# Patient Record
Sex: Female | Born: 1950
Health system: Southern US, Community
[De-identification: ages and names within clinical notes are randomized; demographics above are authoritative.]

## PROBLEM LIST (undated history)

## (undated) DIAGNOSIS — E079 Disorder of thyroid, unspecified: Secondary | ICD-10-CM

## (undated) DIAGNOSIS — E785 Hyperlipidemia, unspecified: Secondary | ICD-10-CM

## (undated) DIAGNOSIS — I10 Essential (primary) hypertension: Secondary | ICD-10-CM

## (undated) HISTORY — DX: Disorder of thyroid, unspecified: E07.9

## (undated) HISTORY — DX: Hyperlipidemia, unspecified: E78.5

## (undated) HISTORY — DX: Essential (primary) hypertension: I10

## (undated) HISTORY — PX: MOUTH SURGERY: SHX715

## (undated) HISTORY — PX: OTHER SURGICAL HISTORY: SHX169

---

## 1998-01-06 ENCOUNTER — Other Ambulatory Visit: Admission: RE | Admit: 1998-01-06 | Discharge: 1998-01-06 | Payer: Self-pay | Admitting: Gynecology

## 1999-02-10 ENCOUNTER — Other Ambulatory Visit: Admission: RE | Admit: 1999-02-10 | Discharge: 1999-02-10 | Payer: Self-pay | Admitting: Gynecology

## 2000-01-03 ENCOUNTER — Encounter: Payer: Self-pay | Admitting: Gynecology

## 2000-01-03 ENCOUNTER — Encounter: Admission: RE | Admit: 2000-01-03 | Discharge: 2000-01-03 | Payer: Self-pay | Admitting: Gynecology

## 2000-03-01 ENCOUNTER — Other Ambulatory Visit: Admission: RE | Admit: 2000-03-01 | Discharge: 2000-03-01 | Payer: Self-pay | Admitting: Gynecology

## 2000-03-04 ENCOUNTER — Encounter: Payer: Self-pay | Admitting: Emergency Medicine

## 2000-03-04 ENCOUNTER — Emergency Department (HOSPITAL_COMMUNITY): Admission: EM | Admit: 2000-03-04 | Discharge: 2000-03-04 | Payer: Self-pay | Admitting: Emergency Medicine

## 2001-01-09 ENCOUNTER — Encounter: Payer: Self-pay | Admitting: Gynecology

## 2001-01-09 ENCOUNTER — Encounter: Admission: RE | Admit: 2001-01-09 | Discharge: 2001-01-09 | Payer: Self-pay | Admitting: Gynecology

## 2001-02-26 ENCOUNTER — Other Ambulatory Visit: Admission: RE | Admit: 2001-02-26 | Discharge: 2001-02-26 | Payer: Self-pay | Admitting: Gynecology

## 2002-01-17 ENCOUNTER — Encounter: Admission: RE | Admit: 2002-01-17 | Discharge: 2002-01-17 | Payer: Self-pay | Admitting: Gynecology

## 2002-01-17 ENCOUNTER — Encounter: Payer: Self-pay | Admitting: Gynecology

## 2002-03-05 ENCOUNTER — Other Ambulatory Visit: Admission: RE | Admit: 2002-03-05 | Discharge: 2002-03-05 | Payer: Self-pay | Admitting: Gynecology

## 2003-02-04 ENCOUNTER — Encounter: Payer: Self-pay | Admitting: Gynecology

## 2003-02-04 ENCOUNTER — Encounter: Admission: RE | Admit: 2003-02-04 | Discharge: 2003-02-04 | Payer: Self-pay | Admitting: Gynecology

## 2003-03-30 ENCOUNTER — Other Ambulatory Visit: Admission: RE | Admit: 2003-03-30 | Discharge: 2003-03-30 | Payer: Self-pay | Admitting: Gynecology

## 2004-02-11 ENCOUNTER — Ambulatory Visit (HOSPITAL_COMMUNITY): Admission: RE | Admit: 2004-02-11 | Discharge: 2004-02-11 | Payer: Self-pay | Admitting: Gynecology

## 2004-04-07 ENCOUNTER — Other Ambulatory Visit: Admission: RE | Admit: 2004-04-07 | Discharge: 2004-04-07 | Payer: Self-pay | Admitting: Gynecology

## 2004-09-11 HISTORY — PX: COLONOSCOPY: SHX174

## 2004-09-11 LAB — HM COLONOSCOPY: HM Colonoscopy: NEGATIVE

## 2004-11-23 ENCOUNTER — Ambulatory Visit: Payer: Self-pay | Admitting: Internal Medicine

## 2004-12-15 ENCOUNTER — Ambulatory Visit: Payer: Self-pay | Admitting: Internal Medicine

## 2004-12-22 ENCOUNTER — Ambulatory Visit: Payer: Self-pay | Admitting: Internal Medicine

## 2005-01-05 ENCOUNTER — Ambulatory Visit: Payer: Self-pay | Admitting: Internal Medicine

## 2005-04-24 ENCOUNTER — Other Ambulatory Visit: Admission: RE | Admit: 2005-04-24 | Discharge: 2005-04-24 | Payer: Self-pay | Admitting: Gynecology

## 2005-05-11 ENCOUNTER — Ambulatory Visit (HOSPITAL_COMMUNITY): Admission: RE | Admit: 2005-05-11 | Discharge: 2005-05-11 | Payer: Self-pay | Admitting: Gynecology

## 2005-06-13 ENCOUNTER — Ambulatory Visit: Payer: Self-pay | Admitting: Internal Medicine

## 2005-06-20 ENCOUNTER — Ambulatory Visit: Payer: Self-pay | Admitting: Internal Medicine

## 2005-07-26 ENCOUNTER — Encounter: Admission: RE | Admit: 2005-07-26 | Discharge: 2005-07-26 | Payer: Self-pay

## 2005-12-18 ENCOUNTER — Ambulatory Visit: Payer: Self-pay | Admitting: Internal Medicine

## 2006-03-22 ENCOUNTER — Ambulatory Visit: Payer: Self-pay | Admitting: Internal Medicine

## 2006-06-12 ENCOUNTER — Ambulatory Visit: Payer: Self-pay | Admitting: Internal Medicine

## 2006-07-04 ENCOUNTER — Ambulatory Visit (HOSPITAL_COMMUNITY): Admission: RE | Admit: 2006-07-04 | Discharge: 2006-07-04 | Payer: Self-pay | Admitting: Gynecology

## 2006-07-30 ENCOUNTER — Encounter: Admission: RE | Admit: 2006-07-30 | Discharge: 2006-07-30 | Payer: Self-pay | Admitting: Gynecology

## 2007-02-19 ENCOUNTER — Ambulatory Visit: Payer: Self-pay | Admitting: Internal Medicine

## 2007-03-14 ENCOUNTER — Ambulatory Visit: Payer: Self-pay | Admitting: Internal Medicine

## 2007-08-02 ENCOUNTER — Ambulatory Visit (HOSPITAL_COMMUNITY): Admission: RE | Admit: 2007-08-02 | Discharge: 2007-08-02 | Payer: Self-pay | Admitting: Gynecology

## 2007-08-29 ENCOUNTER — Encounter: Admission: RE | Admit: 2007-08-29 | Discharge: 2007-08-29 | Payer: Self-pay | Admitting: Gynecology

## 2008-05-22 ENCOUNTER — Ambulatory Visit: Payer: Self-pay | Admitting: Internal Medicine

## 2008-07-03 ENCOUNTER — Telehealth: Payer: Self-pay | Admitting: Internal Medicine

## 2008-08-18 ENCOUNTER — Ambulatory Visit (HOSPITAL_COMMUNITY): Admission: RE | Admit: 2008-08-18 | Discharge: 2008-08-18 | Payer: Self-pay | Admitting: Gynecology

## 2008-08-31 ENCOUNTER — Encounter: Admission: RE | Admit: 2008-08-31 | Discharge: 2008-08-31 | Payer: Self-pay | Admitting: Gynecology

## 2009-05-31 ENCOUNTER — Ambulatory Visit: Payer: Self-pay | Admitting: Internal Medicine

## 2009-05-31 DIAGNOSIS — I10 Essential (primary) hypertension: Secondary | ICD-10-CM | POA: Insufficient documentation

## 2009-05-31 DIAGNOSIS — N959 Unspecified menopausal and perimenopausal disorder: Secondary | ICD-10-CM | POA: Insufficient documentation

## 2009-05-31 DIAGNOSIS — I868 Varicose veins of other specified sites: Secondary | ICD-10-CM | POA: Insufficient documentation

## 2009-06-02 ENCOUNTER — Encounter: Payer: Self-pay | Admitting: Internal Medicine

## 2009-06-02 ENCOUNTER — Encounter (INDEPENDENT_AMBULATORY_CARE_PROVIDER_SITE_OTHER): Payer: Self-pay | Admitting: *Deleted

## 2009-06-02 LAB — CONVERTED CEMR LAB
BUN: 13 mg/dL (ref 6–23)
Basophils Absolute: 0 10*3/uL (ref 0.0–0.1)
Chloride: 106 meq/L (ref 96–112)
Eosinophils Absolute: 0.2 10*3/uL (ref 0.0–0.7)
GFR calc non Af Amer: 91.49 mL/min (ref >60)
Glucose, Bld: 74 mg/dL (ref 70–99)
HCT: 41.4 % (ref 36.0–46.0)
Lymphs Abs: 1.7 10*3/uL (ref 0.7–4.0)
MCHC: 33.2 g/dL (ref 30.0–36.0)
MCV: 95.4 fL (ref 78.0–100.0)
Monocytes Absolute: 0.3 10*3/uL (ref 0.1–1.0)
Platelets: 247 10*3/uL (ref 150.0–400.0)
Potassium: 4 meq/L (ref 3.5–5.1)
RDW: 12.3 % (ref 11.5–14.6)
Total Bilirubin: 0.9 mg/dL (ref 0.3–1.2)

## 2009-06-07 ENCOUNTER — Encounter: Admission: RE | Admit: 2009-06-07 | Discharge: 2009-06-07 | Payer: Self-pay | Admitting: Internal Medicine

## 2009-06-08 ENCOUNTER — Ambulatory Visit: Payer: Self-pay | Admitting: Internal Medicine

## 2009-06-11 ENCOUNTER — Telehealth: Payer: Self-pay | Admitting: Internal Medicine

## 2009-06-11 ENCOUNTER — Encounter (INDEPENDENT_AMBULATORY_CARE_PROVIDER_SITE_OTHER): Payer: Self-pay | Admitting: *Deleted

## 2009-06-11 LAB — CONVERTED CEMR LAB
Free T4: 0.7 ng/dL (ref 0.6–1.6)
TSH: 8.25 microintl units/mL — ABNORMAL HIGH (ref 0.35–5.50)

## 2009-06-17 ENCOUNTER — Ambulatory Visit: Payer: Self-pay | Admitting: Internal Medicine

## 2009-06-17 DIAGNOSIS — E785 Hyperlipidemia, unspecified: Secondary | ICD-10-CM | POA: Insufficient documentation

## 2009-06-17 DIAGNOSIS — E041 Nontoxic single thyroid nodule: Secondary | ICD-10-CM | POA: Insufficient documentation

## 2009-08-19 ENCOUNTER — Ambulatory Visit (HOSPITAL_COMMUNITY): Admission: RE | Admit: 2009-08-19 | Discharge: 2009-08-19 | Payer: Self-pay | Admitting: Gynecology

## 2009-08-23 ENCOUNTER — Ambulatory Visit: Payer: Self-pay | Admitting: Internal Medicine

## 2009-08-30 ENCOUNTER — Ambulatory Visit: Payer: Self-pay | Admitting: Internal Medicine

## 2009-08-30 ENCOUNTER — Encounter (INDEPENDENT_AMBULATORY_CARE_PROVIDER_SITE_OTHER): Payer: Self-pay | Admitting: *Deleted

## 2009-08-30 LAB — CONVERTED CEMR LAB
Direct LDL: 144.8 mg/dL
HDL: 52.1 mg/dL (ref >39.00)
TSH: 1.24 microintl units/mL (ref 0.35–5.50)
Total CHOL/HDL Ratio: 4
VLDL: 12.6 mg/dL (ref 0.0–40.0)

## 2009-08-31 ENCOUNTER — Ambulatory Visit: Payer: Self-pay | Admitting: Internal Medicine

## 2009-09-06 ENCOUNTER — Encounter: Admission: RE | Admit: 2009-09-06 | Discharge: 2009-09-06 | Payer: Self-pay | Admitting: Gynecology

## 2009-11-02 ENCOUNTER — Encounter: Payer: Self-pay | Admitting: Internal Medicine

## 2009-11-17 ENCOUNTER — Telehealth: Payer: Self-pay | Admitting: Internal Medicine

## 2009-11-18 ENCOUNTER — Encounter: Admission: RE | Admit: 2009-11-18 | Discharge: 2009-11-18 | Payer: Self-pay | Admitting: Internal Medicine

## 2009-11-18 ENCOUNTER — Ambulatory Visit: Payer: Self-pay | Admitting: Internal Medicine

## 2009-11-19 ENCOUNTER — Telehealth (INDEPENDENT_AMBULATORY_CARE_PROVIDER_SITE_OTHER): Payer: Self-pay | Admitting: *Deleted

## 2009-11-22 ENCOUNTER — Encounter (INDEPENDENT_AMBULATORY_CARE_PROVIDER_SITE_OTHER): Payer: Self-pay | Admitting: *Deleted

## 2009-11-22 ENCOUNTER — Ambulatory Visit: Payer: Self-pay | Admitting: Internal Medicine

## 2009-11-22 LAB — CONVERTED CEMR LAB
Direct LDL: 144.4 mg/dL
VLDL: 11.2 mg/dL (ref 0.0–40.0)

## 2009-11-25 ENCOUNTER — Encounter: Payer: Self-pay | Admitting: Internal Medicine

## 2010-05-17 ENCOUNTER — Telehealth: Payer: Self-pay | Admitting: Internal Medicine

## 2010-05-18 ENCOUNTER — Encounter: Admission: RE | Admit: 2010-05-18 | Discharge: 2010-05-18 | Payer: Self-pay | Admitting: Endocrinology

## 2010-05-18 ENCOUNTER — Ambulatory Visit: Payer: Self-pay | Admitting: Family Medicine

## 2010-05-27 ENCOUNTER — Ambulatory Visit: Payer: Self-pay | Admitting: Internal Medicine

## 2010-05-27 ENCOUNTER — Telehealth (INDEPENDENT_AMBULATORY_CARE_PROVIDER_SITE_OTHER): Payer: Self-pay | Admitting: *Deleted

## 2010-06-06 ENCOUNTER — Encounter: Payer: Self-pay | Admitting: Internal Medicine

## 2010-07-25 ENCOUNTER — Encounter: Payer: Self-pay | Admitting: Internal Medicine

## 2010-07-25 ENCOUNTER — Ambulatory Visit: Payer: Self-pay | Admitting: Internal Medicine

## 2010-07-25 LAB — CONVERTED CEMR LAB
Cholesterol, target level: 200 mg/dL
LDL Goal: 130 mg/dL

## 2010-08-01 LAB — CONVERTED CEMR LAB
Albumin: 4.2 g/dL (ref 3.5–5.2)
Basophils Relative: 0.5 % (ref 0.0–3.0)
CO2: 29 meq/L (ref 19–32)
Chloride: 102 meq/L (ref 96–112)
Cholesterol: 235 mg/dL — ABNORMAL HIGH (ref 0–200)
Creatinine, Ser: 0.7 mg/dL (ref 0.4–1.2)
Direct LDL: 155.2 mg/dL
Eosinophils Absolute: 0.2 10*3/uL (ref 0.0–0.7)
Glucose, Bld: 81 mg/dL (ref 70–99)
Hemoglobin: 13.3 g/dL (ref 12.0–15.0)
MCHC: 34.5 g/dL (ref 30.0–36.0)
MCV: 94.5 fL (ref 78.0–100.0)
Monocytes Absolute: 0.5 10*3/uL (ref 0.1–1.0)
Neutro Abs: 3.8 10*3/uL (ref 1.4–7.7)
Neutrophils Relative %: 60.9 % (ref 43.0–77.0)
RBC: 4.09 M/uL (ref 3.87–5.11)
Total CHOL/HDL Ratio: 4
Total Protein: 6.9 g/dL (ref 6.0–8.3)

## 2010-08-22 ENCOUNTER — Ambulatory Visit (HOSPITAL_COMMUNITY)
Admission: RE | Admit: 2010-08-22 | Discharge: 2010-08-22 | Payer: Self-pay | Source: Home / Self Care | Attending: Gynecology | Admitting: Gynecology

## 2010-09-23 IMAGING — US US SOFT TISSUE HEAD/NECK
1 series · 14 of 24 positions shown · non-contrast
Comparison: Ultrasound of the thyroid of 11/18/2009

CLINICAL DATA: Follow up thyroid nodules, the patient is on
Synthroid

THYROID ULTRASOUND
TECHNIQUE: Ultrasound examination of the thyroid gland and adjacent
soft tissues was performed.

[Series 1: us soft tissue head/neck · 0.07mm/px · 14 of 24 slices shown]
[im 1/24]
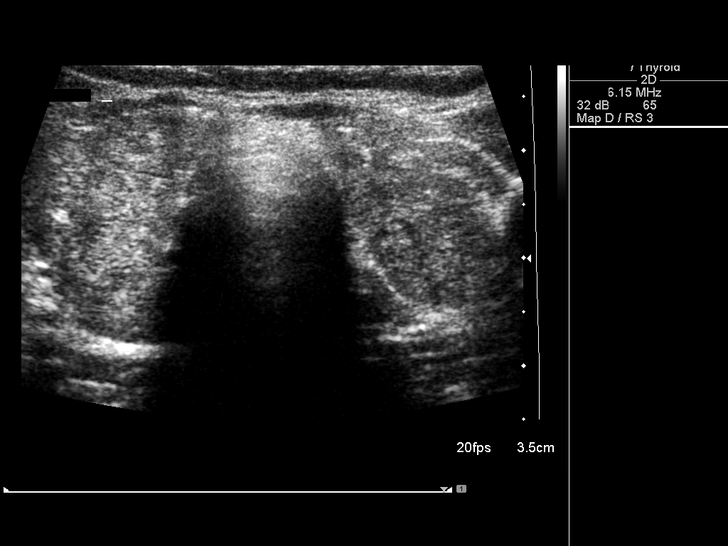
[im 3/24]
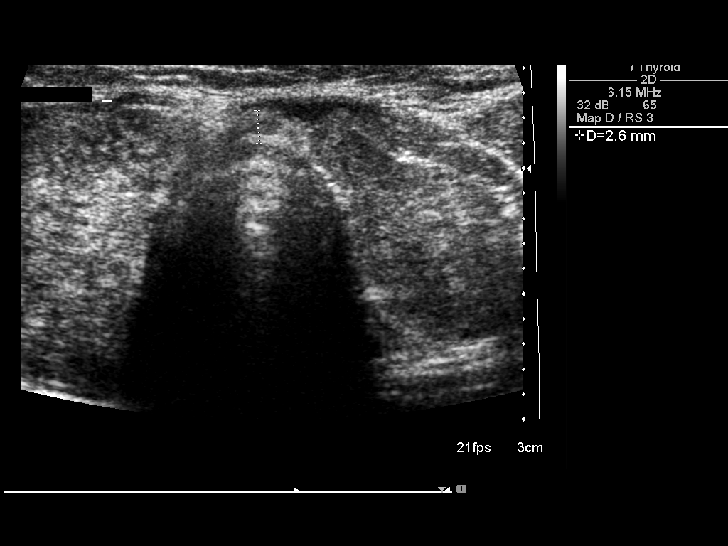
[im 5/24]
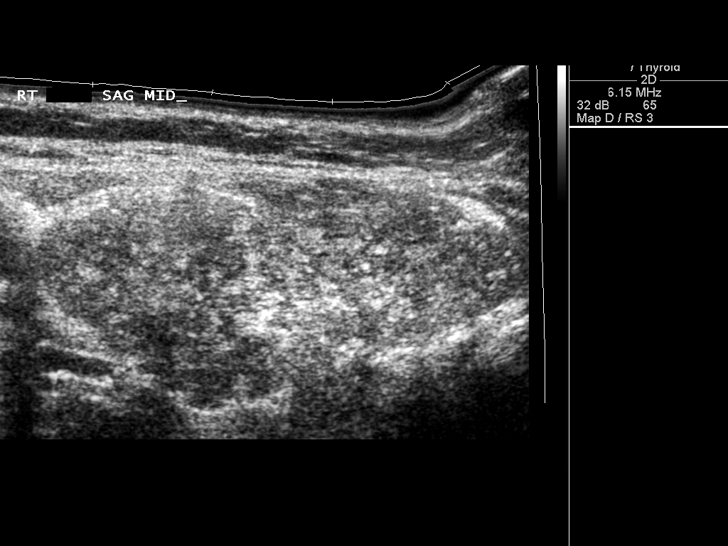
[im 7/24]
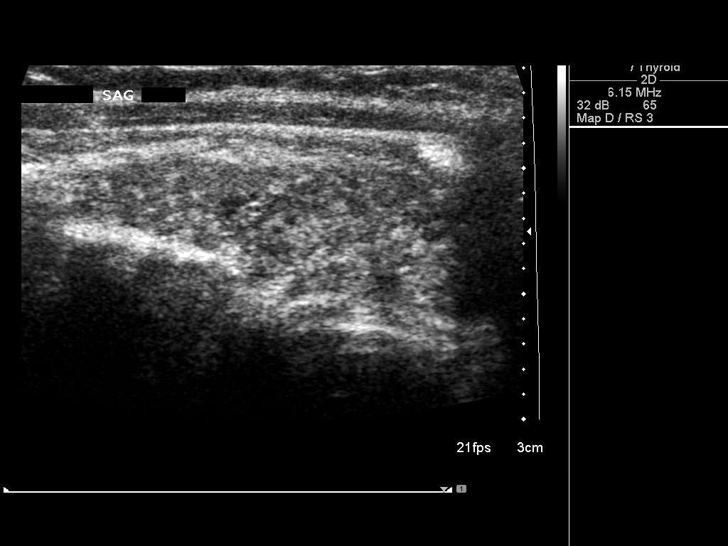
[im 8/24]
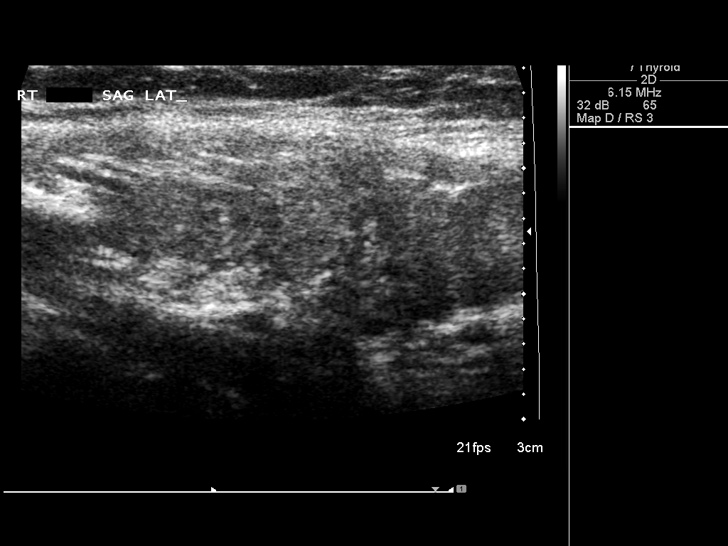
[im 10/24]
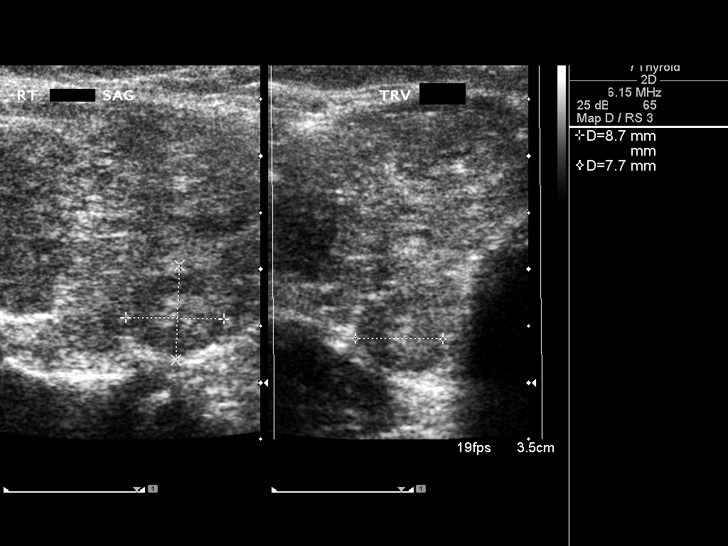
[im 12/24]
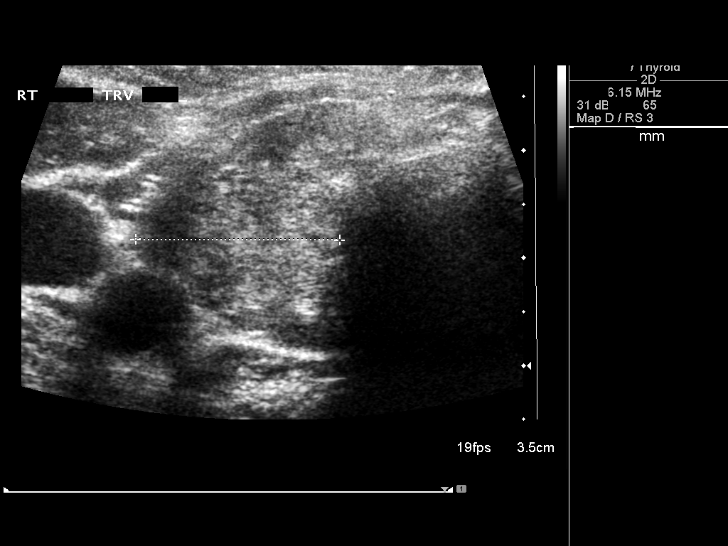
[im 13/24]
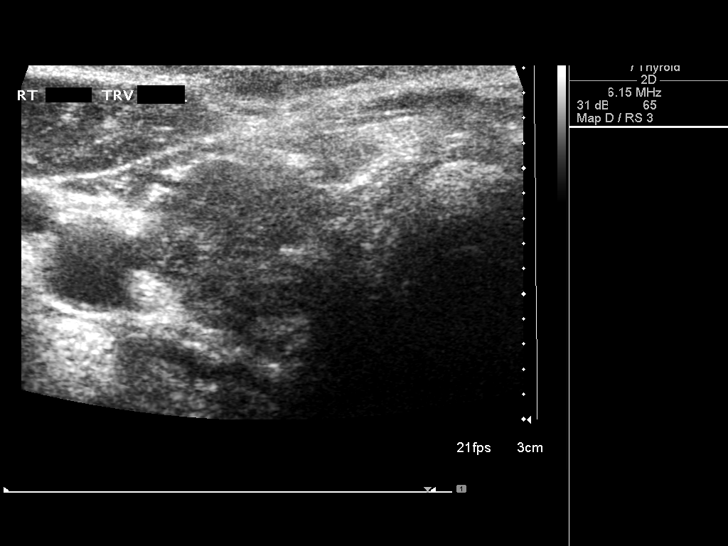
[im 15/24]
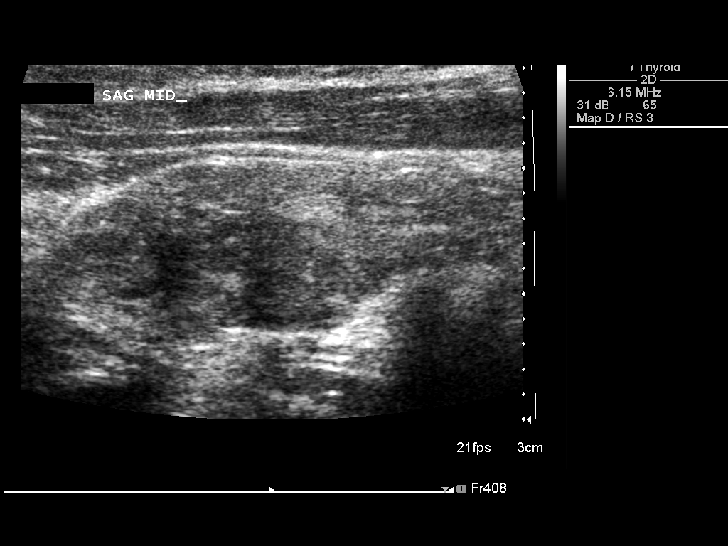
[im 17/24]
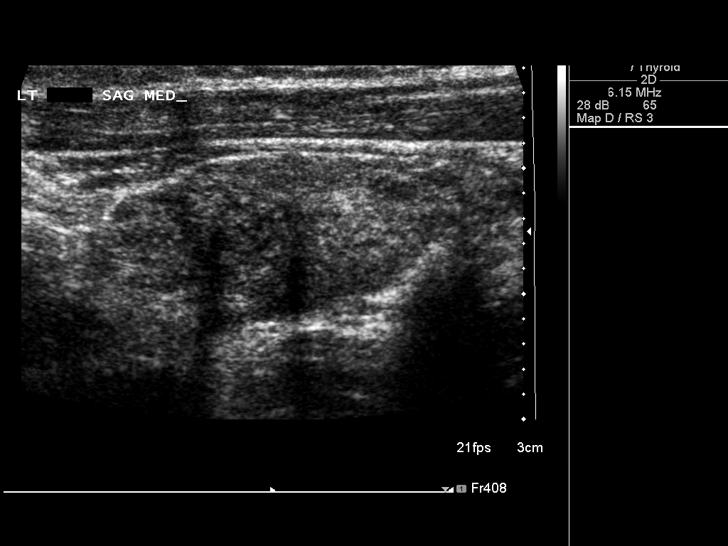
[im 19/24]
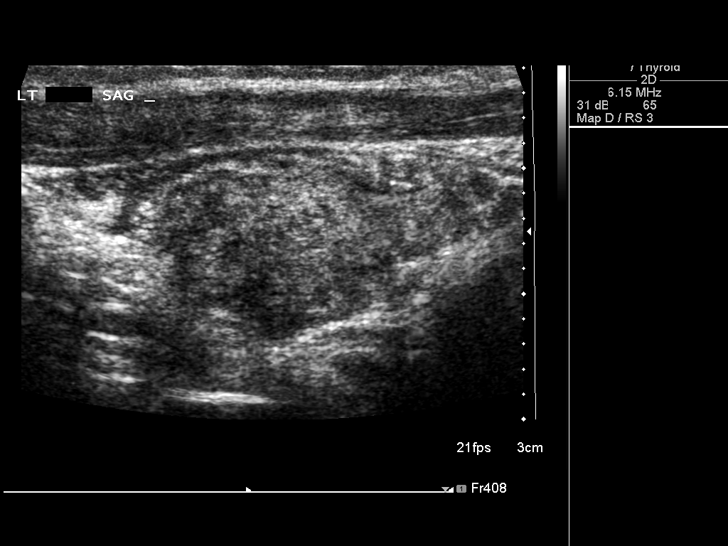
[im 20/24]
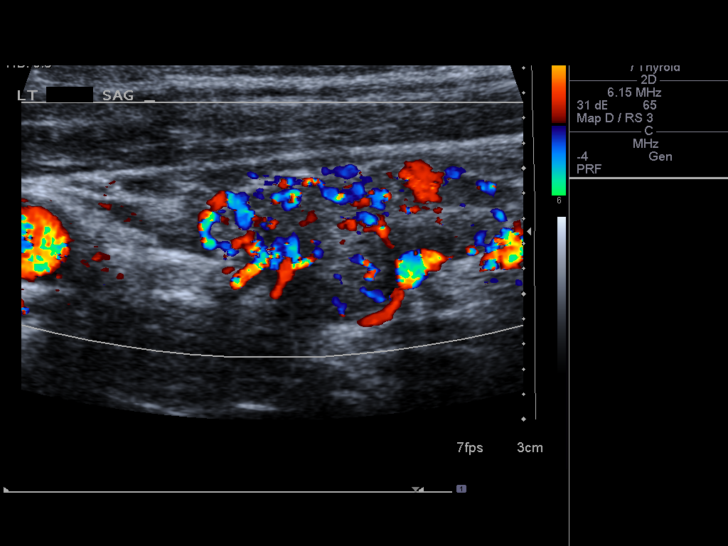
[im 22/24]
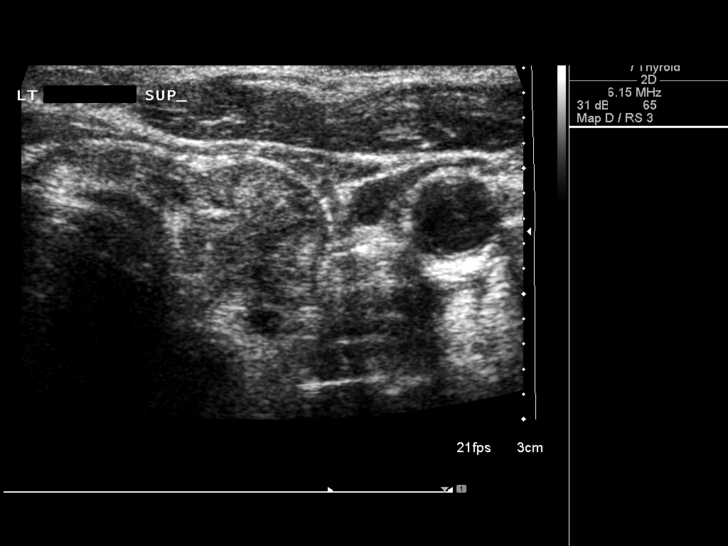
[im 24/24]
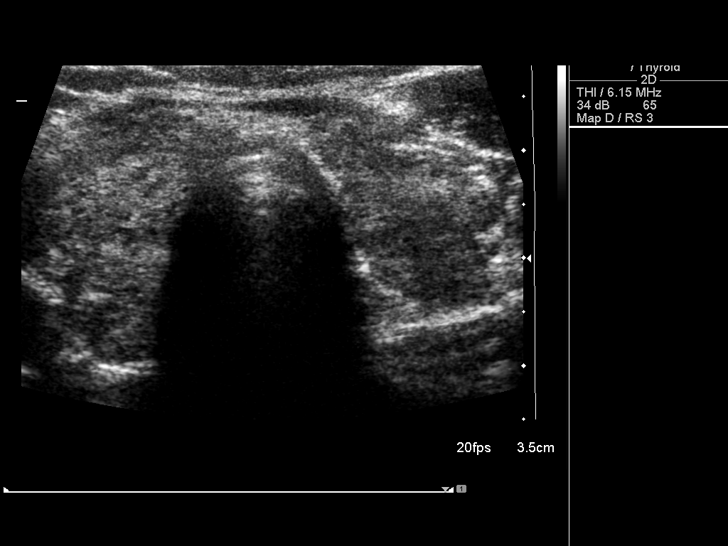

[14 of 24 positions shown; findings below may reference images not displayed]

FINDINGS: Right thyroid lobe:  4.2 x 1.9 x 1.9 cm.  (Previously 5.5 x 2.5 x
2.2 cm).
Left thyroid lobe:  3.7 x 1.4 x 1.5 cm.  (Previously 4.1 x 1.4 x
1.4 cm).
Isthmus:  3 mm which is stable.

Focal nodules:  It the thyroid gland is very inhomogeneous in
echogenicity.  A solid nodule again is noted which is poorly
defined in the lower pole of the right lobe measuring 9 x 8 x 8 mm,
slightly smaller than on the prior study where it measured 12 mm in
maximum diameter.

Lymphadenopathy:  None visualized.
IMPRESSION: Some decrease in size of the right lower lobe thyroid nodule with
slight decrease in size of the thyroid gland which remains
diffusely inhomogeneous.

## 2010-10-02 ENCOUNTER — Encounter: Payer: Self-pay | Admitting: Gynecology

## 2010-10-02 ENCOUNTER — Encounter: Payer: Self-pay | Admitting: Internal Medicine

## 2010-10-04 ENCOUNTER — Encounter
Admission: RE | Admit: 2010-10-04 | Discharge: 2010-10-04 | Payer: Self-pay | Source: Home / Self Care | Attending: Gynecology | Admitting: Gynecology

## 2010-10-13 ENCOUNTER — Encounter: Payer: Self-pay | Admitting: Internal Medicine

## 2010-10-13 NOTE — Consult Note (Signed)
Summary: [facility]   Imported By: Lanelle Bal 12/10/2009 10:05:17  _____________________________________________________________________  External Attachment:    Type:   Image     Comment:   External Document

## 2010-10-13 NOTE — Assessment & Plan Note (Signed)
Summary: FU THYROID U/S /RH.......Jenny Ford   Vital Signs:  Patient profile:   59 year old female Weight:      135 pounds Pulse rate:   74 / minute Resp:     14 per minute BP sitting:   138 / 90  (left arm) Cuff size:   regular  Vitals Entered By: Shonna Chock (November 22, 2009 8:08 AM) CC: Follow-up visit: Discuss Thyroid and Labs (copy given), Hypertension Management Comments REVIEWED MED LIST, PATIENT AGREED DOSE AND INSTRUCTION CORRECT    CC:  Follow-up visit: Discuss Thyroid and Labs (copy given) and Hypertension Management.  History of Present Illness: BP 119/74-138/92 @ home.BP up due toconcerns about her orthopedic issues.  Labs & thyroid US reviewed. RLL essentially unchanged , but TSH up from 1.24 to 4.63. LDL is essentially @ goal of < 140  Hypertension History:      She denies headache, chest pain, palpitations, dyspnea with exertion, orthopnea, PND, peripheral edema, visual symptoms, neurologic problems, syncope, and side effects from treatment.        Positive major cardiovascular risk factors include female age 45 years old or older, hyperlipidemia, and hypertension.  Negative major cardiovascular risk factors include non-tobacco-user status.     Allergies (verified): No Known Drug Allergies  Review of Systems Eyes:  Denies blurring, double vision, and vision loss-both eyes. CV:  Denies leg cramps with exertion. Resp:  Denies cough. Neuro:  Denies numbness and tingling. Allergy:  Denies hives or rash; No lip/tongue swelling.  Physical Exam  General:  Thin but well-nourished; alert,appropriate and cooperative throughout examination Eyes:  No lid lag Neck:  No deformities, masses, or tenderness noted. R thyroid lobe > L Heart:  Normal rate and regular rhythm. S1 and S2 normal without gallop, murmur, click, rub. S4 Pulses:  R and L carotid,radial,dorsalis pedis and posterior tibial pulses are full and equal bilaterally Neurologic:  alert & oriented X3 and DTRs  symmetrical and normal.   No tremor   Impression & Recommendations:  Problem # 1:  HYPOTHYROIDISM (ICD-244.9) Nodule stable despite correction of hypothyroidism. TSH < 5.50 ,but ideal = 1-3. FNA recommended Her updated medication list for this problem includes:    Synthroid 50 Mcg Tabs (Levothyroxine sodium) .Jenny Ford... 1 once daily except 1& 1/2 tues & th  Problem # 2:  HYPERLIPIDEMIA (ICD-272.4) LDL essentially @ NMR Lipoprofile goal of 140  Problem # 3:  THYROID NODULE, RIGHT (ICD-241.0)  Orders: Endocrinology Referral (Endocrine)  Complete Medication List: 1)  Altace 10 Mg Tabs (Ramipril) .... Once daily 2)  Vitamins & Calcium  3)  Synthroid 50 Mcg Tabs (Levothyroxine sodium) .Jenny Ford.. 1 once daily except 1& 1/2 tues & th  Hypertension Assessment/Plan:      The patient's hypertensive risk group is category B: At least one risk factor (excluding diabetes) with no target organ damage.  Today's blood pressure is 138/90.    Patient Instructions: 1)  Referral will be made to an Endocrinologist  for second opinion concerning need for FNA of nodule as recommended. 2)  Check your Blood Pressure regularly. If it is above:135/85 ON AVERAGE  you should make an appointment.  Appended Document: FU THYROID U/S /RH.......Jenny Ford 126/88 BP Recheck

## 2010-10-13 NOTE — Progress Notes (Signed)
Summary: Triage: Request to speak to someone  Phone Note Call from Patient Call back at [phone]   Summary of Call: Message left on VM: Patient would like a call, at that time she will discuss her problem   Shonna Chock CMA  May 17, 2010 4:01 PM   Follow-up for Phone Call        PT states that she has a possible sinus infection. pt c/o headache and nausea. Pt denies any drainage, sore throat or cough.pt states that she has U/S of thyroid schedule for tomorrow so she is unable to come in for OV. pt would like to know if dr hopper would rx something with out seeing her. Pt uses [facility] [address]. pls advise.............Marland KitchenFelecia Deloach CMA  May 17, 2010 4:33 PM     Additional Follow-up for Phone Call Additional follow up Details #1::        Neti pot once daily two times a day followed by Fluticasone nasal spray  until sinuses are clear   Additional Follow-up by: Marga Melnick MD,  May 17, 2010 5:15 PM    Additional Follow-up for Phone Call Additional follow up Details #2::    pt aware rx sent to pharmacy................Marland KitchenFelecia Deloach CMA  May 17, 2010 5:22 PM   New/Updated Medications: FLUTICASONE PROPIONATE 50 MCG/ACT SUSP (FLUTICASONE PROPIONATE) 1 spray two times a day after Neti pot  as needed for sinus congestion Prescriptions: FLUTICASONE PROPIONATE 50 MCG/ACT SUSP (FLUTICASONE PROPIONATE) 1 spray two times a day after Neti pot  as needed for sinus congestion  #1 x 5   Entered and Authorized by:   Marga Melnick MD   Signed by:   Marga Melnick MD on 05/17/2010   Method used:   Printed then faxed to ...       [facility]* (retail)       [address]       [place], [place]  [address]       Ph: [phone]       Fax: [phone]   RxID:   [id number]

## 2010-10-13 NOTE — Assessment & Plan Note (Signed)
Summary: temp - headache/cbs   Vital Signs:  Patient profile:   60 year old female Weight:      135 pounds Temp:     99.4 degrees F oral BP sitting:   130 / 78  (left arm)  Vitals Entered By: Doristine Devoid CMA (May 18, 2010 2:24 PM) CC: fever up to 101.4 HA and sinus pressure    History of Present Illness: 60 yo woman here today for fever and HA.  sxs started late last week.  felt 'pretty good' on Monday- did a lot of housework.  sxs returned yesterday.  developed fever overnight w/ chills and sweats, nausea.  Tm 101.4 today.  nausea is coming in waves.  no vomiting or diarrhea.  + frontal sinus pain.  no tooth pain.  no ear pain, sore throat, cough.  using neti pot and nasal spray.  Allergies (verified): No Known Drug Allergies  Review of Systems      See HPI  Physical Exam  General:  obviously uncomfortable, lying on exam table Head:  NCAT, + TTP over frontal sinuses Eyes:  no injxn or inflammation Ears:  + retraction Nose:  + congestion Mouth:  + PND, no tonsilar enlargement, erythema, exudate Lungs:  Normal respiratory effort, chest expands symmetrically. Lungs are clear to auscultation, no crackles or wheezes. Heart:  Normal rate and regular rhythm. S1 and S2 normal without gallop, murmur, click, rub. S4 Cervical Nodes:  shotty bilateral LAD   Impression & Recommendations:  Problem # 1:  SINUSITIS - ACUTE-NOS (ICD-461.9) Assessment New pt's sxs consistent w/ sinusitis- classic 2nd sickening.  start Amox.  cont nasal spray.  add OTC antihistamine.  phenergan as needed for nausea.  reviewed supportive care and red flags that should prompt return.  Pt expresses understanding and is in agreement w/ this plan. Her updated medication list for this problem includes:    Fluticasone Propionate 50 Mcg/act Susp (Fluticasone propionate) .Marland Kitchen... 1 spray two times a day after neti pot  as needed for sinus congestion    Amoxicillin 500 Mg Tabs (Amoxicillin) .Marland Kitchen... 2 tabs by mouth  two times a day x10 days  Complete Medication List: 1)  Altace 10 Mg Tabs (Ramipril) .... Once daily 2)  Vitamins & Calcium  3)  Synthroid 50 Mcg Tabs (Levothyroxine sodium) .Marland Kitchen.. 1 once daily except 1& 1/2 tues & th 4)  Fluticasone Propionate 50 Mcg/act Susp (Fluticasone propionate) .Marland Kitchen.. 1 spray two times a day after neti pot  as needed for sinus congestion 5)  Amoxicillin 500 Mg Tabs (Amoxicillin) .... 2 tabs by mouth two times a day x10 days 6)  Diflucan 150 Mg Tabs (Fluconazole) .... Once daily.  may repeat in 3 days if symptoms persist 7)  Promethazine Hcl 25 Mg Tabs (Promethazine hcl) .Marland Kitchen.. 1 by mouth q6 as needed for nausea.  will cause drowsiness  Patient Instructions: 1)  This appears to be a sinus infection 2)  Start the Amoxicilin today- take w/ food to avoid upset stomach 3)  Use the Promethazine for the nausea 4)  Continue the nasal spray 5)  Alternate tylenol and ibuprofen every 4 hours for fever, body aches, headaches 6)  Only take the Diflucan if yeast develops 7)  Drink plenty of fluids 8)  Eat what you are able 9)  Hang in there!! Prescriptions: PROMETHAZINE HCL 25 MG  TABS (PROMETHAZINE HCL) 1 by mouth q6 as needed for nausea.  will cause drowsiness  #20 x 0   Entered and Authorized  by:   Neena Rhymes MD   Signed by:   Neena Rhymes MD on 05/18/2010   Method used:   Electronically to        [id number] (retail)       [address]       [place], [place]  [address]       Ph: [phone] or [phone]       Fax: [phone]   RxID:   [id number] DIFLUCAN 150 MG TABS (FLUCONAZOLE) once daily.  may repeat in 3 days if symptoms persist  #2 x 0   Entered and Authorized by:   Neena Rhymes MD   Signed by:   Neena Rhymes MD on 05/18/2010   Method used:   Electronically to        [id number] (retail)       [address]       [place], [place]  [address]       Ph: [phone] or [phone]       Fax: [phone]   RxID:    [id number] AMOXICILLIN 500 MG TABS (AMOXICILLIN) 2 tabs by mouth two times a day x10 days  #40 x 0   Entered and Authorized by:   Neena Rhymes MD   Signed by:   Neena Rhymes MD on 05/18/2010   Method used:   Electronically to        [id number] (retail)       [address]       [place], [place]  [address]       Ph: [phone] or [phone]       Fax: [phone]   RxID:   [id number]

## 2010-10-13 NOTE — Progress Notes (Signed)
Summary: Phone Note-Request for Results  Phone Note Outgoing Call Call back at [phone]   Call placed by: Shonna Chock,  November 19, 2009 5:14 PM Call placed to: Patient Summary of Call: Patient called Felicia's VM indicating that she would like test results.  I called patient and was unable to reach her, left message on VM with the information below, patient to call on Monday if any questions or concerns.  No change in nodules over 6 months. Annual palpation of thyroid with [place] repeat based on that exam & clinical status. Chrae Malloy  November 19, 2009 5:19 PM   Follow-up for Phone Call        Patient here today for follow-up on U/S and to discuss labs Follow-up by: Shonna Chock,  November 22, 2009 8:16 AM

## 2010-10-13 NOTE — Assessment & Plan Note (Signed)
Summary: FOLLOWUP FROM DR TABORI--SINUS///SPH   Vital Signs:  Patient profile:   60 year old female Weight:      136 pounds BMI:     23.61 Temp:     98.2 degrees F oral Pulse rate:   76 / minute Resp:     16 per minute BP sitting:   144 / 98  (left arm) Cuff size:   regular  Vitals Entered By: Shonna Chock CMA (May 27, 2010 10:23 AM) CC: 1 Week follow-up, patient still with pressure over right eye, Headaches   CC:  1 Week follow-up, patient still with pressure over right eye, and Headaches.  History of Present Illness: Headache  as" pressure" above OD persists despite resolution of fever with antibiotics.  The patient denies nausea, vomiting, sweats, tearing of eyes, photophobia, and phonophobia.  The headache is described as constant unless NSAIDS or Tylenol taken.  The location of the pain is only  unilateral on the right.  The patient denies the following high-risk features: fever, neck pain/stiffness, and rash.  Prior treatment has included a NSAID and acetaminophen.No PMH of migraines.    Current Medications (verified): 1)  Altace 10 Mg  Tabs (Ramipril) .... Once Daily 2)  Vitamins & Calcium 3)  Synthroid 75 Mcg Tabs (Levothyroxine Sodium) .Marland Kitchen.. 1 By Mouth Once Daily 4)  Fluticasone Propionate 50 Mcg/act Susp (Fluticasone Propionate) .Marland Kitchen.. 1 Spray Two Times A Day After Neti Pot  As Needed For Sinus Congestion 5)  Amoxicillin 500 Mg Tabs (Amoxicillin) .... 2 Tabs By Mouth Two Times A Day X10 Days  Allergies (verified): No Known Drug Allergies  Review of Systems General:  Denies chills. Eyes:  Denies discharge, eye pain, and red eye. Resp:  Denies cough and sputum productive.  Physical Exam  General:  in no acute distress; alert,appropriate and cooperative throughout examination Eyes:  No corneal or conjunctival inflammation noted. EOMI. Perrla.  Vision grossly normal. Ears:  External ear exam shows no significant lesions or deformities.  Otoscopic examination reveals  clear canals, tympanic membranes are intact bilaterally without bulging, retraction, inflammation or discharge. Hearing is grossly normal bilaterally. Nose:  External nasal examination shows no deformity or inflammation. Nasal mucosa are pink and moist without lesions or exudates. Mouth:  Oral mucosa and oropharynx without lesions or exudates.  Teeth in good repair. Cervical Nodes:  No lymphadenopathy noted Axillary Nodes:  No palpable lymphadenopathy   Impression & Recommendations:  Problem # 1:  HEADACHE (ICD-784.0) ? migraine variant ,ie chronic daily headache Orders: T-Sinuses 1-2 Views (70210TC)  Her updated medication list for this problem includes:    Tramadol Hcl 50 Mg Tabs (Tramadol hcl) .Marland Kitchen... 1 every 6 hrs as needed for pain  Problem # 2:  SINUSITIS - ACUTE-NOS (ICD-461.9)  Her updated medication list for this problem includes:    Fluticasone Propionate 50 Mcg/act Susp (Fluticasone propionate) .Marland Kitchen... 1 spray two times a day after neti pot  as needed for sinus congestion    Amoxicillin 500 Mg Tabs (Amoxicillin) .Marland Kitchen... 2 tabs by mouth two times a day x10 days  Orders: T-Sinuses 1-2 Views (70210TC)  Complete Medication List: 1)  Altace 10 Mg Tabs (Ramipril) .... Once daily 2)  Vitamins & Calcium  3)  Synthroid 75 Mcg Tabs (Levothyroxine sodium) .Marland Kitchen.. 1 by mouth once daily 4)  Fluticasone Propionate 50 Mcg/act Susp (Fluticasone propionate) .Marland Kitchen.. 1 spray two times a day after neti pot  as needed for sinus congestion 5)  Amoxicillin 500 Mg Tabs (Amoxicillin) .Marland KitchenMarland KitchenMarland Kitchen  2 tabs by mouth two times a day x10 days 6)  Tramadol Hcl 50 Mg Tabs (Tramadol hcl) .Marland Kitchen.. 1 every 6 hrs as needed for pain  Patient Instructions: 1)  Try Excedrin Migraine as needed @ 1st sign of headache. Prescriptions: TRAMADOL HCL 50 MG TABS (TRAMADOL HCL) 1 every 6 hrs as needed for pain  #30 x 0   Entered and Authorized by:   Marga Melnick MD   Signed by:   Marga Melnick MD on 05/27/2010   Method used:   Faxed  to ...       [id number] (retail)       [address]       [place], [place]  [address]       Ph: [phone] or [phone]       Fax: [phone]   RxID:   [id number]

## 2010-10-13 NOTE — Progress Notes (Signed)
Summary: FYI- no show for thyroid ultrasound   Phone Note From Other Clinic   Caller: [place] Summary of Call: patient no showed for thyroid ultrasound. Initial call taken by: Doristine Devoid,  November 17, 2009 4:57 PM  Follow-up for Phone Call        noted; please call to inquire why Follow-up by: Marga Melnick MD,  November 18, 2009 10:40 AM  Additional Follow-up for Phone Call Additional follow up Details #1::        left message to call office..............Marland KitchenFelecia Deloach CMA  November 18, 2009 11:08 AM   pt return call there was a mix up with appointment pt would like to go today to have procedure done. infor given to referral coordinator to reschedule appt.........Marland KitchenFelecia Deloach CMA  November 18, 2009 11:19 AM     Additional Follow-up for Phone Call Additional follow up Details #2::    I S/W PATIENT, SHE IS CALLING [facility] TO RSC'D HER MISSED U/S APPT, & WILL HAVE DONE TODAY AT ONE OF [facility]'S LOCATIONS. Follow-up by: Magdalen Spatz [place],  November 18, 2009 11:59 AM

## 2010-10-13 NOTE — Letter (Signed)
Summary: Primary Care Consult Scheduled Letter  [facility] [place]  [address] [place], [place] [address]   Phone: [phone]  Fax: [phone]      11/22/2009 MRN: [id number]  [facility] [address] [place], [place]  [address]    Dear Jenny Ford,    We have scheduled an appointment for you.  At the recommendation of Dr. Marga Melnick, we have scheduled you a consult with Dr. Dorisann Frames of [facility] on 01-03-2010 at 10:00am.  Their address is [address], [place] [address]. The office phone number is [phone].  If this appointment day and time is not convenient for you, please feel free to call the office of the doctor you are being referred to at the number listed above and reschedule the appointment.    It is important for you to keep your scheduled appointments. We are here to make sure you are given good patient care.   Thank you,    Renee, Patient Care Coordinator [place] [facility]

## 2010-10-13 NOTE — Miscellaneous (Signed)
Summary: [facility]  [facility]   Imported By: Lanelle Bal 11/19/2009 08:42:01  _____________________________________________________________________  External Attachment:    Type:   Image     Comment:   External Document

## 2010-10-13 NOTE — Assessment & Plan Note (Signed)
Summary: cpx & lab/cbs   Vital Signs:  Patient profile:   60 year old female Height:      64 inches Weight:      137 pounds BMI:     23.60 Temp:     98.0 degrees F oral Pulse rate:   64 / minute Resp:     14 per minute BP sitting:   142 / 98  (left arm) Cuff size:   regular  Vitals Entered By: Shonna Chock CMA (July 25, 2010 9:56 AM) CC: CPX and fasting labs , recheck on B/P 142/96, Lipid Management Comments Patient refused to update any vaccines today    CC:  CPX and fasting labs , recheck on B/P 142/96, and Lipid Management.  History of Present Illness:          Mrs. Tax is here for a physical; she is essentially  asymptomatic.             Hypertension Follow-Up: The patient denies lightheadedness, urinary frequency, headaches, edema, and fatigue.  The patient denies the following associated symptoms: chest pain, chest pressure, exercise intolerance, dyspnea, palpitations, and syncope.  Compliance with medications (by patient report) has been near 100%.  The patient reports that dietary compliance has been good.  The patient reports exercising 3X per week as treadmill, cycle & now Zumba.  Adjunctive measures currently used by the patient include salt restriction.  BP @ 125/81 as average.   Lipid Management History:      Positive NCEP/ATP III risk factors include female age 72 years old or older, family history for ischemic heart disease (males less than 23 years old), and hypertension.  Negative NCEP/ATP III risk factors include non-diabetic, non-tobacco-user status, no ASHD (atherosclerotic heart disease), no prior stroke/TIA, no peripheral vascular disease, and no history of aortic aneurysm.     Allergies (verified): No Known Drug Allergies  Past History:  Past Medical History: Hypertension Hyperlipidemia: LDL 161(0960/454), TG 106, HDL 57; LDL goal = < 140, ideally < 100. Framingham Study LDL goal = < 130. Thyroid nodue , Dr Talmage Nap  Past Surgical History: G2 P2 ;  Septoplasty ; Colonoscopy negative  2006, Dr Juanda Chance; Tendon tear LLE , Dr Harriet Pho, Podiatrist  Family History: Father: COAD, Anaphylaxis to Allergy shot Mother: breast cancer, CAD Siblings: bro :CAD, anxiety,stents ;twin: COAD; M uncles: MI (one in 64s)  Social History: Occupation:Senior Chemical engineer Never Smoked Alcohol use-yes: essentially none  Review of Systems  The patient denies anorexia, fever, weight loss, weight gain, vision loss, decreased hearing, prolonged cough, hemoptysis, abdominal pain, melena, hematochezia, severe indigestion/heartburn, hematuria, suspicious skin lesions, depression, unusual weight change, abnormal bleeding, enlarged lymph nodes, and angioedema.         Dr Talmage Nap states nodule has shrunk  by [place] measure Allergy:  Complains of itching eyes; Symptoms controlled by Claritin.Marland Kitchen  Physical Exam  General:  well-nourished,alert,appropriate and cooperative throughout examination Head:  Normocephalic and atraumatic without obvious abnormalities. Eyes:  No corneal or conjunctival inflammation noted. Perrla. Funduscopic exam benign, without hemorrhages, exudates or papilledema.  Ears:  External ear exam shows no significant lesions or deformities.  Otoscopic examination reveals clear canals, tympanic membranes are intact bilaterally without bulging, retraction, inflammation or discharge. Hearing is grossly normal bilaterally. Nose:  External nasal examination shows no deformity or inflammation. Nasal mucosa are pink and moist without lesions or exudates. Mouth:  Oral mucosa and oropharynx without lesions or exudates.  Teeth in good repair. Neck:  No deformities, masses, or  tenderness noted. R lobe larger than L ; no nodues palpable Lungs:  Normal respiratory effort, chest expands symmetrically. Lungs are clear to auscultation, no crackles or wheezes. Heart:  normal rate, regular rhythm, no gallop, no rub, no JVD, no HJR, and grade 1 /6 systolic  murmur.   Abdomen:  Bowel sounds positive,abdomen soft and non-tender without masses, organomegaly or hernias noted. Genitalia:  Dr Greta Doom Msk:  No deformity or scoliosis noted of thoracic or lumbar spine.   Pulses:  R and L carotid,radial,dorsalis pedis and posterior tibial pulses are full and equal bilaterally Extremities:  No clubbing, cyanosis, edema, or deformity noted with normal full range of motion of all joints.   Neurologic:  alert & oriented X3 and DTRs symmetrical and normal.   Skin:  Intact without suspicious lesions or rashes Cervical Nodes:  No lymphadenopathy noted Axillary Nodes:  No palpable lymphadenopathy Psych:  memory intact for recent and remote, normally interactive, and good eye contact.     Impression & Recommendations:  Problem # 1:  ROUTINE GENERAL MEDICAL EXAM@HEALTH  CARE FACL (ICD-V70.0)  Orders: EKG w/ Interpretation (93000) Venipuncture (16109) TLB-Lipid Panel (80061-LIPID) TLB-BMP (Basic Metabolic Panel-BMET) (80048-METABOL) TLB-CBC Platelet - w/Differential (85025-CBCD) TLB-Hepatic/Liver Function Pnl (80076-HEPATIC)  Problem # 2:  HYPERTENSION (ICD-401.9)  Her updated medication list for this problem includes:    Altace 10 Mg Tabs (Ramipril) ..... Once daily  Problem # 3:  THYROID NODULE, RIGHT (ICD-241.0)  Problem # 4:  HYPERLIPIDEMIA (ICD-272.4)  Complete Medication List: 1)  Altace 10 Mg Tabs (Ramipril) .... Once daily 2)  Vitamins & Calcium  3)  Synthroid 75 Mcg Tabs (Levothyroxine sodium) .Marland Kitchen.. 1 by mouth once daily 4)  Tramadol Hcl 50 Mg Tabs (Tramadol hcl) .Marland Kitchen.. 1 every 6 hrs as needed for pain 5)  Loratadine 10 Mg Tabs (Loratadine) .Marland Kitchen.. 1 once daily as needed  Lipid Assessment/Plan:      Based on NCEP/ATP III, the patient's risk factor category is "2 or more risk factors and a calculated 10 year CAD risk of > 20%".  The patient's lipid goals are as follows: Total cholesterol goal is 200; LDL cholesterol goal is 130; HDL cholesterol goal  is 40; Triglyceride goal is 150.    Patient Instructions: 1)  Take your BP cuff to all medical appts. 2)  Check your Blood Pressure regularly. Your goal = AVERAGE  < 135/85. Prescriptions: LORATADINE 10 MG TABS (LORATADINE) 1 once daily as needed  #90 x 3   Entered and Authorized by:   Marga Melnick MD   Signed by:   Marga Melnick MD on 07/25/2010   Method used:   Print then Give to Patient   RxID:   [id number] ALTACE 10 MG  TABS (RAMIPRIL) once daily  #90 x 3   Entered and Authorized by:   Marga Melnick MD   Signed by:   Marga Melnick MD on 07/25/2010   Method used:   Faxed to ...       [id number] (retail)       [address]       [place], [place]  [address]       Ph: [phone] or [phone]       Fax: [phone]   RxID:   [id number]    Orders Added: 1)  Est. Patient 40-64 years [99396] 2)  EKG w/ Interpretation [93000] 3)  Venipuncture [36415] 4)  TLB-Lipid Panel [80061-LIPID] 5)  TLB-BMP (Basic Metabolic Panel-BMET) [80048-METABOL] 6)  TLB-CBC Platelet - w/Differential [85025-CBCD]  7)  TLB-Hepatic/Liver Function Pnl [80076-HEPATIC]     Appended Document: cpx & lab/cbs

## 2010-10-13 NOTE — Letter (Signed)
Summary: [facility]   Imported By: Lanelle Bal 06/22/2010 13:15:05  _____________________________________________________________________  External Attachment:    Type:   Image     Comment:   External Document

## 2010-10-13 NOTE — Progress Notes (Signed)
Summary: DG Sinuses results  Phone Note Outgoing Call Call back at [facility] Phone [phone]   Call placed by: Shonna Chock CMA,  May 27, 2010 4:59 PM Call placed to: Patient Summary of Call: Left message on voicemail informing patient of results:  Super , no sinusitis present (copy of report to be mailed)  Shonna Chock CMA  May 27, 2010 5:00 PM

## 2010-11-02 NOTE — Letter (Signed)
Summary: [facility]  [facility]   Imported By: Maryln Gottron 10/26/2010 15:07:01  _____________________________________________________________________  External Attachment:    Type:   Image     Comment:   External Document

## 2011-04-07 ENCOUNTER — Encounter: Payer: Self-pay | Admitting: Internal Medicine

## 2011-05-22 ENCOUNTER — Other Ambulatory Visit: Payer: Self-pay | Admitting: Endocrinology

## 2011-05-22 DIAGNOSIS — E049 Nontoxic goiter, unspecified: Secondary | ICD-10-CM

## 2011-05-26 ENCOUNTER — Ambulatory Visit
Admission: RE | Admit: 2011-05-26 | Discharge: 2011-05-26 | Disposition: A | Discharge status: Home / Self Care | Payer: BC Managed Care – PPO | Source: Ambulatory Visit | Attending: Endocrinology | Admitting: Endocrinology

## 2011-05-26 DIAGNOSIS — E049 Nontoxic goiter, unspecified: Secondary | ICD-10-CM

## 2011-06-12 ENCOUNTER — Telehealth: Payer: Self-pay | Admitting: Internal Medicine

## 2011-06-12 NOTE — Telephone Encounter (Signed)
Patient called at 2:15 waned appt said bp up & has been up several days - scheduled appt [id number] but patient became tearful & said she just feels bad

## 2011-06-12 NOTE — Telephone Encounter (Signed)
BP this am 169/106. Pt c/o headache, increased stress, and elevated BP. Pt notes that she had stress fracture in left foot x6 weeks ago and just got boot removed x1week. Pt denies SOB, dizziness, chest pain or numbness or tingling in arms. Pt advise if symptoms increase ED otherwise f/u @ Scheduled OV tomorrow.

## 2011-06-12 NOTE — Telephone Encounter (Signed)
Amlodipine 5 mg daily dispense 30. Blood pressure monitor as follows; office visit if no better.Blood Pressure Goal  Ideally is an AVERAGE < 135/85. This AVERAGE should be calculated from @ least 5-7 BP readings taken @ different times of day on different days of week. You should not respond to isolated BP readings , but rather the AVERAGE for that week

## 2011-06-13 ENCOUNTER — Ambulatory Visit (INDEPENDENT_AMBULATORY_CARE_PROVIDER_SITE_OTHER): Payer: BC Managed Care – PPO | Admitting: Internal Medicine

## 2011-06-13 DIAGNOSIS — I10 Essential (primary) hypertension: Secondary | ICD-10-CM

## 2011-06-13 DIAGNOSIS — R002 Palpitations: Secondary | ICD-10-CM

## 2011-06-13 DIAGNOSIS — R519 Headache, unspecified: Secondary | ICD-10-CM

## 2011-06-13 DIAGNOSIS — R51 Headache: Secondary | ICD-10-CM

## 2011-06-13 MED ORDER — TRAMADOL HCL 50 MG PO TABS: 50.0000 mg | ORAL_TABLET | Freq: Four times a day (QID) | ORAL | Status: DC | PRN

## 2011-06-13 MED ORDER — AMLODIPINE BESYLATE 5 MG PO TABS: 5.0000 mg | ORAL_TABLET | Freq: Every day | ORAL | Status: DC

## 2011-06-13 NOTE — Progress Notes (Signed)
Subjective:    Patient ID: Jenny Ford, female    DOB: 04/21/1951, 59 y.o.   MRN: [id number]  HPI BP ELEVATION:  She relates her significant blood pressure rises ( up to 161/104 two days ago) to an increase in her Synthroid dose by her endocrinologist. Specifically on 9/17 Synthroid was raised from 75-88 mcg. This was to suppress growth of a thyroid nodule. At that time her TSH was 2.65, a therapeutic value, but probably not high enough to prevent  Progression of nodules Disease Monitoring  Blood pressure range:low of  116/76 on 9/3  Chest pain: no   Dyspnea: no             Palpitations when  anxious  Claudication: no             Headaches : in am , ?due to coffee ; relieved with OTC migraine pill occasionally ( ? Excedrin migraine equivalent)  Medication compliance: yes  Medication Side Effects  Lightheadedness: no   Urinary frequency: no   Edema: no   I   Preventitive Healthcare:  Exercise: no , due to MS issues. 18 mos ago she injured her left ankle tendon. She experienced a stress fracture in the left foot 7/12. She will been for 5 weeks. She denies significant pain at this time.  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 Wearing the boot for 5 weeks was also a ignificant stress.     Objective:   Physical Exam Gen.: Healthy and well-nourished in appearance. Alert, appropriate and cooperative throughout exam. Head: Normocephalic without obvious abnormalities  Eyes: No corneal or conjunctival inflammation noted. Pupils equal round reactive to light and accommodation. Fundal exam is benign without hemorrhages,or  Exudate. I can not optimally assess disc edges. Extraocular motion intact. Vision grossly normal. No lid lag  Neck: No deformities, masses, or tenderness noted. Thyroid : Enlarged right firm thyroid nodule; smaller from left lobe. Lungs:  Normal respiratory effort; chest expands symmetrically. Lungs are clear to auscultation without rales, wheezes, or increased work of breathing. Heart: Normal rate and rhythm. Normal S1 and S2. No gallop, click, or rub. S 4 w/o murmur. Abdomen: Bowel sounds normal; abdomen soft and nontender. No masses, organomegaly or hernias noted. No aortic aneurysm or renal artery bruits                                                                              Musculoskeletal/extremities:  No clubbing, cyanosis, edema, or deformity noted. .Joints normal. Nail health  Good; no onycholysis Vascular: Carotid, radial artery, dorsalis pedis and  posterior tibial pulses are full and equal. No bruits present. Neurologic: Alert and oriented x3. Deep tendon reflexes symmetrical and normal. No tremor         Skin: Intact without suspicious lesions or rashes. Lymph: No cervical, axillary lymphadenopathy present. Psych: Mood and affect are normal. Normally interactive  Assessment & Plan:  #1 hypertension exacerbation, multifactorial. Factors include exogenous stress, decreased exercise and medications for headaches.  #2 thyroid nodule, responsive to thyroid replacement. Her endocrinologist will determine optimal TSH; I would expect that number to be in the range of at least 0.35. She should discuss her concerns about the Synthroid with endocrinologist. Nodule which would be expected to increase in size, more likely than not, and less as adequate suppression  #3 headaches due to hypertension  Plan: See orders and recommendations

## 2011-06-13 NOTE — Patient Instructions (Addendum)
Your BP goal = AVERAGE < 135/85. Avoid ingestion of  excess salt/sodium.Cook with pepper & other spices . Use the salt substitute "No Salt"(unless your potassium has been elevated) OR the Mrs Sharilyn Sites products to season food @ the table. Avoid foods which taste salty or "vinegary" as their sodium contentet will be high.  To prevent palpitations or premature beats, avoid stimulants such as decongestants, diet pills, nicotine, or caffeine (coffee, tea, cola, or chocolate) to excess.  Please keep a diary of your headaches . Document  each occurrence on the calendar with notation of : #1 any prodrome ( any non headache symptom such as marked fatigue,visual changes, ,etc ) which precedes actual headache ; #2) severity on 1-10 scale; #3) any triggers ( food/ drink,enviromenntal or weather changes ,physical or emotional stress) in 8-12 hour period prior to the headache; & #4) response to any medications or other intervention. Please have your ophthalmologist re- evaluate your fundi because of elevated blood pressure.

## 2011-06-13 NOTE — Telephone Encounter (Signed)
Discuss with patient. Pt would rather wait to discuss issues further at appt before starting any meds. Pt has OV for today scheduled.

## 2011-06-23 ENCOUNTER — Telehealth: Payer: Self-pay | Admitting: *Deleted

## 2011-06-23 NOTE — Telephone Encounter (Signed)
BP average: 136.9/ 87.8. Isolated BP 152/102 today,  129/93 yesterday, 06-21-11 BP 143/95. Pt is asymptomatic and headache have resolved. Pt has pending OV 07-27-11.Marland KitchenPlease advise

## 2011-06-23 NOTE — Telephone Encounter (Signed)
Discuss with patient  

## 2011-06-23 NOTE — Telephone Encounter (Signed)
The  blood pressure is almost at the preferred goal of an average of less than 135/85. It is stable from this average; increase the amlodipine from 5 mg to one & 1/2  half or 7.5 mg daily.

## 2011-07-17 ENCOUNTER — Other Ambulatory Visit: Payer: Self-pay | Admitting: Internal Medicine

## 2011-07-19 ENCOUNTER — Telehealth: Payer: Self-pay | Admitting: *Deleted

## 2011-07-19 NOTE — Telephone Encounter (Signed)
Lipid/Hep/BMP/CBCD/TSH/Stool Cards v70.0/272.4/995.20/401.9

## 2011-07-19 NOTE — Telephone Encounter (Signed)
Pt left VM that she is scheduled on 07-27-11 for CPX. Pt would like to have labs done prior to that. Please advise on lab orders and Dx.

## 2011-07-21 ENCOUNTER — Other Ambulatory Visit: Payer: Self-pay | Admitting: Internal Medicine

## 2011-07-21 DIAGNOSIS — Z0000 Encounter for general adult medical examination without abnormal findings: Secondary | ICD-10-CM

## 2011-07-21 DIAGNOSIS — E785 Hyperlipidemia, unspecified: Secondary | ICD-10-CM

## 2011-07-21 DIAGNOSIS — T887XXA Unspecified adverse effect of drug or medicament, initial encounter: Secondary | ICD-10-CM

## 2011-07-21 DIAGNOSIS — I10 Essential (primary) hypertension: Secondary | ICD-10-CM

## 2011-07-24 ENCOUNTER — Other Ambulatory Visit (INDEPENDENT_AMBULATORY_CARE_PROVIDER_SITE_OTHER): Payer: BC Managed Care – PPO

## 2011-07-24 DIAGNOSIS — Z0000 Encounter for general adult medical examination without abnormal findings: Secondary | ICD-10-CM

## 2011-07-24 DIAGNOSIS — T887XXA Unspecified adverse effect of drug or medicament, initial encounter: Secondary | ICD-10-CM

## 2011-07-24 DIAGNOSIS — I10 Essential (primary) hypertension: Secondary | ICD-10-CM

## 2011-07-24 DIAGNOSIS — E785 Hyperlipidemia, unspecified: Secondary | ICD-10-CM

## 2011-07-24 NOTE — Progress Notes (Signed)
12  

## 2011-07-25 LAB — CBC WITH DIFFERENTIAL/PLATELET
Basophils Relative: 0.3 % (ref 0.0–3.0)
Eosinophils Absolute: 0.1 10*3/uL (ref 0.0–0.7)
HCT: 37.5 % (ref 36.0–46.0)
Hemoglobin: 12.8 g/dL (ref 12.0–15.0)
Lymphs Abs: 1.5 10*3/uL (ref 0.7–4.0)
MCHC: 34.1 g/dL (ref 30.0–36.0)
MCV: 94.9 fl (ref 78.0–100.0)
Monocytes Absolute: 0.3 10*3/uL (ref 0.1–1.0)
Neutro Abs: 3.6 10*3/uL (ref 1.4–7.7)
RBC: 3.96 Mil/uL (ref 3.87–5.11)

## 2011-07-25 LAB — HEPATIC FUNCTION PANEL
ALT: 16 U/L (ref 0–35)
Bilirubin, Direct: 0 mg/dL (ref 0.0–0.3)
Total Bilirubin: 0.6 mg/dL (ref 0.3–1.2)

## 2011-07-25 LAB — BASIC METABOLIC PANEL
CO2: 27 mEq/L (ref 19–32)
Chloride: 108 mEq/L (ref 96–112)
Creatinine, Ser: 0.7 mg/dL (ref 0.4–1.2)
Potassium: 4.4 mEq/L (ref 3.5–5.1)

## 2011-07-25 LAB — LIPID PANEL
HDL: 53.9 mg/dL (ref >39.00)
Total CHOL/HDL Ratio: 4
VLDL: 10.8 mg/dL (ref 0.0–40.0)

## 2011-07-27 ENCOUNTER — Ambulatory Visit (INDEPENDENT_AMBULATORY_CARE_PROVIDER_SITE_OTHER): Payer: BC Managed Care – PPO | Admitting: Internal Medicine

## 2011-07-27 ENCOUNTER — Encounter: Payer: Self-pay | Admitting: Internal Medicine

## 2011-07-27 VITALS — BP 118/64 | HR 97 | Temp 98.3°F | Resp 12 | Ht 64.0 in | Wt 133.6 lb

## 2011-07-27 DIAGNOSIS — Z0000 Encounter for general adult medical examination without abnormal findings: Secondary | ICD-10-CM

## 2011-07-27 DIAGNOSIS — I10 Essential (primary) hypertension: Secondary | ICD-10-CM

## 2011-07-27 DIAGNOSIS — R51 Headache: Secondary | ICD-10-CM

## 2011-07-27 DIAGNOSIS — E785 Hyperlipidemia, unspecified: Secondary | ICD-10-CM

## 2011-07-27 MED ORDER — FLUTICASONE PROPIONATE 50 MCG/ACT NA SUSP: NASAL | Status: AC

## 2011-07-27 MED ORDER — AMLODIPINE BESYLATE 5 MG PO TABS: ORAL_TABLET | ORAL | Status: DC

## 2011-07-27 NOTE — Patient Instructions (Addendum)
Preventive Health Care: Exercise  30-45  minutes a day, 3-4 days a week. Walking is especially valuable in preventing Osteoporosis. Eat a low-fat diet with lots of fruits and vegetables, up to 7-9 servings per day. Consume less than 30 grams of sugar per day from foods & drinks with High Fructose Corn Syrup as # 1,2,3 or #4 on label.  Please review Dr Gildardo Griffes book Eat, Drink & Be Healthy for dietary cholesterol information.  Health Care Power of Attorney & Living Will place you in charge of your health care  decisions. Verify these are  in place. Blood Pressure Goal  Ideally is an AVERAGE < 135/85. This AVERAGE should be calculated from @ least 5-7 BP readings taken @ different times of day on different days of week. You should not respond to isolated BP readings , but rather the AVERAGE for that week . The TSH is 0.17 symptoms she scheduled to see her endocrinologist 11/30. I'll defer change in the thyroid dose to the endocrinologist. .Share results with all health care providers seen

## 2011-07-27 NOTE — Progress Notes (Signed)
Subjective:    Patient ID: Jenny Ford, female    DOB: 09-19-1950, 60 y.o.   MRN: [id number]  HPI  Jenny Ford  is here for a physical;acute issues are delineated below.      Review of Systems HYPERTENSION: Disease Monitoring: Blood pressure range- 103/72- 140/88  Chest pain, palpitations- no       Dyspnea- no Medications: Compliance- yes  Lightheadedness,Syncope- no    Edema- no Headaches :dull , aching  R frontal ; resolution with mobilization. She denies any purulent secretions from the nose. She believes that her blood pressure does elevate after taking the thyroid in the morning  HYPERLIPIDEMIA: Medications: Compliance- seeing Nutritionist      ROS:Abd pain, bowel changes- no   Muscle aches- no She describes itchy eyes and uses some Systane eyedrops. She denies sneezing or other extrinsic symptoms. She does question allergic component to her headache.        Objective:   Physical Exam Gen.: Thin but healthy and well-nourished in appearance. Alert, appropriate and cooperative throughout exam. Head: Normocephalic without obvious abnormalities Eyes: No corneal or conjunctival inflammation noted. Pupils equal round reactive to light and accommodation. Fundal exam is benign without hemorrhages, exudate, papilledema. Extraocular motion intact. Vision grossly normal. Ears: External  ear exam reveals no significant lesions or deformities. Canals clear .TMs normal. Hearing is grossly normal bilaterally. Nose: External nasal exam reveals no deformity or inflammation. Nasal mucosa are pink and moist. No lesions or exudates noted.   Mouth: Oral mucosa and oropharynx reveal no lesions or exudates. Teeth in good repair. Neck: No deformities, masses, or tenderness noted. Range of motion normal. Thyroid  Firm; physiologic asymmetry. Lungs: Normal respiratory effort; chest expands symmetrically. Lungs are clear to auscultation without rales, wheezes, or increased work of  breathing. Heart: Normal rate and rhythm. Normal S1 and S2. No gallop, click, or rub. S 4 with slurring ; no murmur. Abdomen: Bowel sounds normal; abdomen soft and nontender. No masses, organomegaly or hernias noted. Genitalia: as per Gyn   .                                                                                   Musculoskeletal/extremities: No deformity or scoliosis noted of  the thoracic or lumbar spine. No clubbing, cyanosis, edema, or deformity noted. Range of motion  normal .Tone & strength  normal.Joints normal. Nail health  good. Vascular: Carotid, radial artery, dorsalis pedis and  posterior tibial pulses are full and equal. No bruits present. Neurologic: Alert and oriented x3. Deep tendon reflexes symmetrical and normal.         Skin: Intact without suspicious lesions or rashes. Lymph: No cervical, axillary  lymphadenopathy present. Psych: Mood and affect are normal. Normally interactive  Assessment & Plan:  #1 comprehensive physical exam; no acute findings #2 see Problem List with Assessments & Recommendations Plan: see Orders

## 2011-08-02 ENCOUNTER — Other Ambulatory Visit: Payer: BC Managed Care – PPO

## 2011-08-02 NOTE — Progress Notes (Signed)
12  

## 2011-09-13 ENCOUNTER — Other Ambulatory Visit (HOSPITAL_COMMUNITY): Payer: Self-pay | Admitting: Gynecology

## 2011-09-13 DIAGNOSIS — Z1231 Encounter for screening mammogram for malignant neoplasm of breast: Secondary | ICD-10-CM

## 2011-09-19 ENCOUNTER — Other Ambulatory Visit: Payer: Self-pay | Admitting: Endocrinology

## 2011-09-19 DIAGNOSIS — E049 Nontoxic goiter, unspecified: Secondary | ICD-10-CM

## 2011-09-30 ENCOUNTER — Emergency Department (HOSPITAL_COMMUNITY)
Admission: EM | Admit: 2011-09-30 | Discharge: 2011-09-30 | Disposition: A | Discharge status: Home / Self Care | Payer: BC Managed Care – PPO | Attending: Emergency Medicine | Admitting: Emergency Medicine

## 2011-09-30 ENCOUNTER — Encounter (HOSPITAL_COMMUNITY): Payer: Self-pay

## 2011-09-30 ENCOUNTER — Emergency Department (HOSPITAL_COMMUNITY): Payer: BC Managed Care – PPO

## 2011-09-30 DIAGNOSIS — W19XXXA Unspecified fall, initial encounter: Secondary | ICD-10-CM

## 2011-09-30 DIAGNOSIS — I10 Essential (primary) hypertension: Secondary | ICD-10-CM | POA: Insufficient documentation

## 2011-09-30 DIAGNOSIS — S8000XA Contusion of unspecified knee, initial encounter: Secondary | ICD-10-CM | POA: Insufficient documentation

## 2011-09-30 DIAGNOSIS — M25539 Pain in unspecified wrist: Secondary | ICD-10-CM | POA: Insufficient documentation

## 2011-09-30 DIAGNOSIS — M25569 Pain in unspecified knee: Secondary | ICD-10-CM | POA: Insufficient documentation

## 2011-09-30 DIAGNOSIS — W010XXA Fall on same level from slipping, tripping and stumbling without subsequent striking against object, initial encounter: Secondary | ICD-10-CM | POA: Insufficient documentation

## 2011-09-30 DIAGNOSIS — Z79899 Other long term (current) drug therapy: Secondary | ICD-10-CM | POA: Insufficient documentation

## 2011-09-30 DIAGNOSIS — Y9289 Other specified places as the place of occurrence of the external cause: Secondary | ICD-10-CM | POA: Insufficient documentation

## 2011-09-30 DIAGNOSIS — E785 Hyperlipidemia, unspecified: Secondary | ICD-10-CM | POA: Insufficient documentation

## 2011-09-30 MED ORDER — ACETAMINOPHEN 500 MG PO TABS: 500.0000 mg | ORAL_TABLET | Freq: Four times a day (QID) | ORAL | Status: AC | PRN

## 2011-09-30 NOTE — ED Notes (Signed)
Fell at car dealership this am- pt c/o of left knee and left wrist pain

## 2011-09-30 NOTE — ED Provider Notes (Signed)
Medical screening examination/treatment/procedure(s) were performed by non-physician practitioner and as supervising physician I was immediately available for consultation/collaboration.  Gerhard Munch, MD 09/30/11 [phone]

## 2011-09-30 NOTE — ED Provider Notes (Signed)
History     CSN: [id number]  Arrival date & time 09/30/11  2256   First MD Initiated Contact with Patient 09/30/11 2302      Chief Complaint  Patient presents with  . Fall  . Knee Pain  . Wrist Pain    (Consider location/radiation/quality/duration/timing/severity/associated sxs/prior treatment) HPI  61 year old female presenting to the ED complaining of a recent fall. Patient states she was at the car dealership at this morning when she fell as she steps out from a car. Patient states there was black ice which causes her to fall. Patient fall and hit her left knee and left wrist. He denies hitting her head or loss of consciousness. She noticed increasing pain to her left knee and this. She is able to ambulate. She requesting x-rays to rule out possible fracture.  Pt denies headache, vision changes, cp, sob, abd pain, numbness or weakness.    Past Medical History  Diagnosis Date  . Hypertension   . Thyroid disease     thyroid nodule...Dr.Balan  . Hyperlipidemia     LDL 172(1674/444),TG 106  . LDL goal = < 140    Past Surgical History  Procedure Date  . G2 p2   . Septopasty   . Tendon tear      L ankle     Family History  Problem Relation Age of Onset  . Cancer Mother     BREAST  . Coronary artery disease Mother     angina  . Coronary artery disease Brother     STENTS;ANXIETY  . Heart attack Maternal Uncle     2 uncles; 1 in his 60s & 1 in 16s  . Heart attack Maternal Grandmother     after 65    History  Substance Use Topics  . Smoking status: Never Smoker   . Smokeless tobacco: Not on file  . Alcohol Use: No    OB History    Grav Para Term Preterm Abortions TAB SAB Ect Mult Living                  Review of Systems  All other systems reviewed and are negative.    Allergies  Review of patient's allergies indicates no known allergies.  Home Medications   Current Outpatient Rx  Name Route Sig Dispense Refill  . AMLODIPINE BESYLATE 5 MG PO TABS   1 1/2 by mouth daily ; goal = average < 135/85 135 tablet 3  . FLUTICASONE PROPIONATE 50 MCG/ACT NA SUSP  1 spray in each nostril twice a day as needed. Use the "crossover" technique as discussed  16 g 2  . LEVOTHYROXINE SODIUM 88 MCG PO TABS Oral Take 88 mcg by mouth daily.      Marland Kitchen ONE-DAILY MULTI VITAMINS PO TABS Oral Take by mouth. Vitamins & Calcium     . RAMIPRIL 10 MG PO CAPS  TAKE 1 CAPSULE EVERY DAY 90 capsule 3  . TRAMADOL HCL 50 MG PO TABS Oral Take 1 tablet (50 mg total) by mouth every 6 (six) hours as needed for pain. 20 tablet 0    BP 144/79  Pulse 80  Temp(Src) 98 F (36.7 C) (Oral)  Resp 18  Wt 130 lb (58.968 kg)  SpO2 100%  Physical Exam  Nursing note and vitals reviewed. Constitutional:       Awake, alert, nontoxic appearance  HENT:  Head: Atraumatic.  Eyes: Right eye exhibits no discharge. Left eye exhibits no discharge.  Neck: Neck supple.  Pulmonary/Chest: Effort normal. She exhibits no tenderness.  Abdominal: There is no tenderness. There is no rebound.  Musculoskeletal: She exhibits no tenderness.       Left shoulder: Normal.       Left elbow: Normal.       Left wrist: She exhibits tenderness. She exhibits normal range of motion, no bony tenderness, no swelling, no effusion and no deformity.       Left hip: Normal.       Left knee: She exhibits ecchymosis. She exhibits normal range of motion, no swelling, no laceration and no erythema. tenderness found.       Left ankle: Normal.       Baseline ROM, no obvious new focal weakness  Neurological:       Mental status and motor strength appears baseline for patient and situation  Skin: No rash noted.  Psychiatric: She has a normal mood and affect.    ED Course  Procedures (including critical care time)  Labs Reviewed - No data to display No results found.   No diagnosis found.    MDM  Patient has a mechanical fall when slipping from black ice.  Examination of L wrist and L knee was unremarkable  except superficial skin abrasion and mild ecchymosis.  Xray of L wrist and L knee ordered.   11:39 PM X-ray of the left wrist, and left knee, shows no acute fracture or dislocation. Reassurance given. Ice pack given. RICE instruction given. Acetaminophen given for pain.     Fayrene Helper, PA-C 09/30/11 2339

## 2011-10-11 ENCOUNTER — Ambulatory Visit (HOSPITAL_COMMUNITY)
Admission: RE | Admit: 2011-10-11 | Discharge: 2011-10-11 | Disposition: A | Discharge status: Home / Self Care | Payer: BC Managed Care – PPO | Source: Ambulatory Visit | Attending: Gynecology | Admitting: Gynecology

## 2011-10-11 DIAGNOSIS — Z1231 Encounter for screening mammogram for malignant neoplasm of breast: Secondary | ICD-10-CM

## 2011-10-23 ENCOUNTER — Other Ambulatory Visit: Payer: BC Managed Care – PPO

## 2011-11-10 ENCOUNTER — Ambulatory Visit
Admission: RE | Admit: 2011-11-10 | Discharge: 2011-11-10 | Disposition: A | Discharge status: Home / Self Care | Payer: BC Managed Care – PPO | Source: Ambulatory Visit | Attending: Endocrinology | Admitting: Endocrinology

## 2011-11-10 DIAGNOSIS — E049 Nontoxic goiter, unspecified: Secondary | ICD-10-CM

## 2011-11-13 ENCOUNTER — Encounter: Payer: Self-pay | Admitting: Internal Medicine

## 2011-11-24 ENCOUNTER — Other Ambulatory Visit: Payer: Self-pay | Admitting: Gynecology

## 2011-11-24 DIAGNOSIS — Z803 Family history of malignant neoplasm of breast: Secondary | ICD-10-CM

## 2011-12-04 ENCOUNTER — Ambulatory Visit
Admission: RE | Admit: 2011-12-04 | Discharge: 2011-12-04 | Disposition: A | Discharge status: Home / Self Care | Payer: BC Managed Care – PPO | Source: Ambulatory Visit | Attending: Gynecology | Admitting: Gynecology

## 2011-12-04 DIAGNOSIS — Z803 Family history of malignant neoplasm of breast: Secondary | ICD-10-CM

## 2011-12-04 MED ORDER — GADOBENATE DIMEGLUMINE 529 MG/ML IV SOLN
12.0000 mL | Freq: Once | INTRAVENOUS | Status: AC | PRN
  Administered 2011-12-04: 12 mL via INTRAVENOUS

## 2012-05-23 ENCOUNTER — Other Ambulatory Visit: Payer: Self-pay | Admitting: Endocrinology

## 2012-05-23 DIAGNOSIS — E049 Nontoxic goiter, unspecified: Secondary | ICD-10-CM

## 2012-05-24 ENCOUNTER — Ambulatory Visit
Admission: RE | Admit: 2012-05-24 | Discharge: 2012-05-24 | Disposition: A | Discharge status: Home / Self Care | Payer: BC Managed Care – PPO | Source: Ambulatory Visit | Attending: Endocrinology | Admitting: Endocrinology

## 2012-05-24 DIAGNOSIS — E049 Nontoxic goiter, unspecified: Secondary | ICD-10-CM

## 2012-07-20 ENCOUNTER — Other Ambulatory Visit: Payer: Self-pay | Admitting: Internal Medicine

## 2012-08-14 ENCOUNTER — Encounter: Payer: Self-pay | Admitting: Internal Medicine

## 2012-08-14 ENCOUNTER — Ambulatory Visit (INDEPENDENT_AMBULATORY_CARE_PROVIDER_SITE_OTHER): Payer: BC Managed Care – PPO | Admitting: Internal Medicine

## 2012-08-14 VITALS — BP 128/90 | HR 86 | Temp 98.0°F | Resp 12 | Ht 64.0 in | Wt 137.4 lb

## 2012-08-14 DIAGNOSIS — Z0000 Encounter for general adult medical examination without abnormal findings: Secondary | ICD-10-CM

## 2012-08-14 DIAGNOSIS — Z780 Asymptomatic menopausal state: Secondary | ICD-10-CM

## 2012-08-14 NOTE — Patient Instructions (Addendum)
Use an anti-inflammatory cream such as Aspercreme or Zostrix cream twice a day to the left knee as needed. In lieu of this warm moist compresses or  hot water bottle can be used. Do not apply ice to the knees.  Exercise  30-45  minutes a day, 3-4 days a week. Walking is especially valuable in preventing Osteoporosis. Eat a low-fat diet with lots of fruits and vegetables, up to 7-9 servings per day.  Consume less than 30 grams of sugar per day from foods & drinks with High Fructose Corn Syrup as #2,3 or #4 on label. Health Care Power of Attorney & Living Will place you in charge of your health care  decisions. Verify these are  in place. Please discuss the asymmetric oral osteomata with your Dentist and also the Otolaryngologist when seen. Blood Pressure Goal  Ideally is an AVERAGE < 135/85. This AVERAGE should be calculated from @ least 5-7 BP readings taken @ different times of day on different days of week. You should not respond to isolated BP readings , but rather the AVERAGE for that week  If you activate My Chart; the results can be released to you as soon as they populate from the lab. If you choose not to use this program; the labs have to be reviewed, copied & mailed   causing a delay in getting the results to you.

## 2012-08-14 NOTE — Addendum Note (Signed)
Addended byMarga Melnick F on: 08/14/2012 04:16 PM   Modules accepted: Orders

## 2012-08-14 NOTE — Progress Notes (Signed)
  Subjective:    Patient ID: Jenny Ford, female    DOB: 15-Aug-1951, 61 y.o.   MRN: [id number]  HPI  Mrs Pontius is here for a physical; she denies acute issues.      Review of Systems Amazingly thyroid nodules have resolved as of the ultrasound in September of this year with suppressive therapy from Dr Talmage Nap.  She exercises for 30 minutes at least 2-3 times per week without symptoms. She specifically denies chest pain, palpitations, dyspnea, or claudication. She does have occasional lower extremity pain @ site of prior tendon injury.  Her diet is heart healthy.There is coronary disease in the family. Her advanced testing has revealed her LDL goal less than 140.  She has tinnitus in the left ear; she plans to see an otolaryngologist    Objective:   Physical Exam Gen.: Thin but healthy and well-nourished in appearance. Alert, appropriate and cooperative throughout exam. Head: Normocephalic without obvious abnormalities  Eyes: No corneal or conjunctival inflammation noted. Pupils equal round reactive to light and accommodation. Fundal exam is benign without hemorrhages, exudate, papilledema. Extraocular motion intact. Vision grossly normal. Ears: External  ear exam reveals no significant lesions or deformities. Canals clear .TMs normal. Hearing is grossly normal bilaterally. Nose: External nasal exam reveals no deformity or inflammation. Nasal mucosa are pink and moist. No lesions or exudates noted.   Mouth: Oral mucosa and oropharynx reveal no lesions or exudates. Teeth in good repair. 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 Range of motion & Thyroid normal. Lungs: Normal respiratory effort; chest expands symmetrically. Lungs are clear to auscultation without rales, wheezes, or increased work of breathing. Heart: Normal rate and rhythm. Normal S1 and S2. No gallop, click, or rub. S4 w/o  Murmur. BP recheck  120/88. Abdomen: Bowel sounds normal; abdomen soft and nontender. No masses, organomegaly or hernias noted. Genitalia: Dr Greta Doom, Gyn Musculoskeletal/extremities: No deformity or scoliosis noted of  the thoracic or lumbar spine. No clubbing, cyanosis, edema, or deformity noted. Range of motion  normal .Tone & strength  normal.Joints normal. Nail health  good. Vascular: Carotid, radial artery, dorsalis pedis and  posterior tibial pulses are full and equal. No bruits present. Neurologic: Alert and oriented x3. Deep tendon reflexes symmetrical and normal.          Skin: Intact without suspicious lesions or rashes. Lymph: No cervical, axillary lymphadenopathy present. Psych: Mood and affect are normal. Normally interactive                                                                                         Assessment & Plan:  #1 comprehensive physical exam; no acute findings #2 asymmetric oral osteomata Plan: see Orders

## 2012-08-15 LAB — CBC WITH DIFFERENTIAL/PLATELET
Basophils Absolute: 0 10*3/uL (ref 0.0–0.1)
Eosinophils Absolute: 0.1 10*3/uL (ref 0.0–0.7)
Lymphocytes Relative: 25.4 % (ref 12.0–46.0)
MCHC: 33.5 g/dL (ref 30.0–36.0)
Monocytes Relative: 6.6 % (ref 3.0–12.0)
Neutrophils Relative %: 66.6 % (ref 43.0–77.0)
RDW: 13.1 % (ref 11.5–14.6)

## 2012-08-15 LAB — LIPID PANEL
HDL: 50.6 mg/dL (ref >39.00)
Total CHOL/HDL Ratio: 4
VLDL: 11.4 mg/dL (ref 0.0–40.0)

## 2012-08-15 LAB — BASIC METABOLIC PANEL
CO2: 26 mEq/L (ref 19–32)
Calcium: 9.2 mg/dL (ref 8.4–10.5)
Creatinine, Ser: 0.7 mg/dL (ref 0.4–1.2)
GFR: 86.21 mL/min (ref >60.00)
Glucose, Bld: 70 mg/dL (ref 70–99)
Sodium: 138 mEq/L (ref 135–145)

## 2012-08-15 LAB — HEPATIC FUNCTION PANEL
AST: 21 U/L (ref 0–37)
Albumin: 4.1 g/dL (ref 3.5–5.2)
Alkaline Phosphatase: 74 U/L (ref 39–117)
Bilirubin, Direct: 0.1 mg/dL (ref 0.0–0.3)
Total Bilirubin: 0.5 mg/dL (ref 0.3–1.2)

## 2012-08-15 LAB — LDL CHOLESTEROL, DIRECT: Direct LDL: 150.1 mg/dL

## 2012-08-27 ENCOUNTER — Ambulatory Visit (INDEPENDENT_AMBULATORY_CARE_PROVIDER_SITE_OTHER)
Admission: RE | Admit: 2012-08-27 | Discharge: 2012-08-27 | Disposition: A | Discharge status: Home / Self Care | Payer: BC Managed Care – PPO | Source: Ambulatory Visit | Attending: Internal Medicine | Admitting: Internal Medicine

## 2012-08-27 DIAGNOSIS — Z780 Asymptomatic menopausal state: Secondary | ICD-10-CM

## 2012-09-02 ENCOUNTER — Other Ambulatory Visit: Payer: BC Managed Care – PPO

## 2012-09-09 ENCOUNTER — Other Ambulatory Visit: Payer: BC Managed Care – PPO

## 2012-09-12 ENCOUNTER — Encounter: Payer: BC Managed Care – PPO | Admitting: Internal Medicine

## 2012-10-22 ENCOUNTER — Other Ambulatory Visit: Payer: Self-pay | Admitting: Internal Medicine

## 2012-11-01 ENCOUNTER — Other Ambulatory Visit: Payer: Self-pay | Admitting: Internal Medicine

## 2012-11-04 ENCOUNTER — Ambulatory Visit (INDEPENDENT_AMBULATORY_CARE_PROVIDER_SITE_OTHER): Payer: BC Managed Care – PPO | Admitting: Internal Medicine

## 2012-11-04 ENCOUNTER — Ambulatory Visit: Payer: BC Managed Care – PPO | Admitting: Internal Medicine

## 2012-11-04 ENCOUNTER — Encounter: Payer: Self-pay | Admitting: Internal Medicine

## 2012-11-04 VITALS — BP 138/86 | HR 90 | Wt 140.0 lb

## 2012-11-04 DIAGNOSIS — M8580 Other specified disorders of bone density and structure, unspecified site: Secondary | ICD-10-CM | POA: Insufficient documentation

## 2012-11-04 DIAGNOSIS — L570 Actinic keratosis: Secondary | ICD-10-CM

## 2012-11-04 DIAGNOSIS — D169 Benign neoplasm of bone and articular cartilage, unspecified: Secondary | ICD-10-CM | POA: Insufficient documentation

## 2012-11-04 DIAGNOSIS — S6990XA Unspecified injury of unspecified wrist, hand and finger(s), initial encounter: Secondary | ICD-10-CM

## 2012-11-04 DIAGNOSIS — M899 Disorder of bone, unspecified: Secondary | ICD-10-CM

## 2012-11-04 DIAGNOSIS — L851 Acquired keratosis [keratoderma] palmaris et plantaris: Secondary | ICD-10-CM

## 2012-11-04 DIAGNOSIS — S6992XA Unspecified injury of left wrist, hand and finger(s), initial encounter: Secondary | ICD-10-CM

## 2012-11-04 NOTE — Progress Notes (Signed)
Subjective:    Patient ID: Jenny Ford, female    DOB: Mar 06, 1951, 62 y.o.   MRN: [id number]  HPI  She's had a skin lesion at the left hip area for 9-12 months. There was no specific injury or trigger for this. She states it has remained stable without change in size or color.  The car door slammed on her left hand 03/31/13 resulting in significant swelling and bruising. She has a residual loss at the base of the index finger and resolving ecchymosis. She appropriately treated this with ice and elevation immediately. She did have the intraoral osteoma evaluated by her oral surgeon. She states that this isn't asymmetric benign torus.  The December 2013 bone density was reviewed. She does have significant hip osteopenia. She has never taken any bone building therapy. She has no history of fractures. Her mother did have osteoporosis late in life.          Review of Systems   The torus is asymptomatic; she does not plan any elective oral surgery  She has no constitutional symptoms of fever, chills, sweats, or unexplained weight loss.        Objective:   Physical Exam Gen.: Healthy and well-nourished in appearance. Alert, appropriate and cooperative throughout exam.   Head: Normocephalic without obvious abnormalities   Mouth: Oral mucosa and oropharynx reveals torus R maxilla. Teeth in good repair. Neck: No deformities, masses, or tenderness noted.  Thyroid normal.                                Musculoskeletal/extremities: No deformity or scoliosis noted of  the thoracic or lumbar spine.  No clubbing, cyanosis, edema, or significant extremity  deformity noted. Tone & strength  Normal. Joints normal. Nail health good. Able to make a fist with the left hand. Full range of motion of the hand and wrist. Hematoma in the intertriginous tissues between the thumb and index finger of the left hand. Able to lie down & sit up w/o help. Negative SLR bilaterally Vascular: Radial artery pulses  are full and equal.  Neurologic: Alert and oriented x3. Deep tendon reflexes symmetrical and normal.        Skin: Intact without suspicious lesions or rashes.7x2 mm flat , irregular keratosis L hip. Irregular resolving ecchymosis at the radial aspect of the left hand. Lymph: No cervical, axillary, or inguinal lymphadenopathy present. Psych: Mood and affect are normal. Normally interactive                                                                                                               Assessment & Plan:  #1 significant osteopenia #2 torus, benign right maxilla. I question whether the  presence of this lesion would  have any import in reference to treating the significant osteopenia with bisphosphonates. #3 benign keratosis left hip #4 resolving soft tissue trauma left hand. Clinically no evidence of fracture despite osteopenia. Plan: See orders and recommendations

## 2012-11-04 NOTE — Patient Instructions (Addendum)
Use  Aveeno Daily  Moisturizing Lotion  twice a day  for the skin lesion. Bathe with moisturizing liquid soap , not bar soap. Review and correct the record as indicated. Please share record with all medical staff seen.

## 2012-11-05 ENCOUNTER — Ambulatory Visit: Payer: BC Managed Care – PPO | Admitting: Internal Medicine

## 2012-11-08 ENCOUNTER — Other Ambulatory Visit (HOSPITAL_COMMUNITY): Payer: Self-pay | Admitting: Gynecology

## 2012-11-08 DIAGNOSIS — Z1231 Encounter for screening mammogram for malignant neoplasm of breast: Secondary | ICD-10-CM

## 2012-11-08 LAB — VITAMIN D 1,25 DIHYDROXY: Vitamin D 1, 25 (OH)2 Total: 67 pg/mL (ref 18–72)

## 2012-11-11 ENCOUNTER — Ambulatory Visit: Payer: BC Managed Care – PPO | Admitting: Internal Medicine

## 2012-11-14 ENCOUNTER — Other Ambulatory Visit: Payer: Self-pay | Admitting: Endocrinology

## 2012-11-14 DIAGNOSIS — E049 Nontoxic goiter, unspecified: Secondary | ICD-10-CM

## 2012-11-20 ENCOUNTER — Ambulatory Visit (HOSPITAL_COMMUNITY)
Admission: RE | Admit: 2012-11-20 | Discharge: 2012-11-20 | Disposition: A | Discharge status: Home / Self Care | Payer: BC Managed Care – PPO | Source: Ambulatory Visit | Attending: Gynecology | Admitting: Gynecology

## 2012-11-20 DIAGNOSIS — Z1231 Encounter for screening mammogram for malignant neoplasm of breast: Secondary | ICD-10-CM | POA: Insufficient documentation

## 2012-11-22 ENCOUNTER — Telehealth: Payer: Self-pay | Admitting: *Deleted

## 2012-11-22 NOTE — Telephone Encounter (Signed)
Patient called requesting copy of bone density results. This was printed and mailed to patient.

## 2012-12-02 ENCOUNTER — Telehealth: Payer: Self-pay | Admitting: *Deleted

## 2012-12-02 ENCOUNTER — Encounter: Payer: Self-pay | Admitting: *Deleted

## 2012-12-02 NOTE — Telephone Encounter (Signed)
Spoke with pt advised of Vit D results.

## 2012-12-08 ENCOUNTER — Encounter: Payer: Self-pay | Admitting: Internal Medicine

## 2012-12-18 ENCOUNTER — Ambulatory Visit (INDEPENDENT_AMBULATORY_CARE_PROVIDER_SITE_OTHER): Payer: BC Managed Care – PPO | Admitting: Internal Medicine

## 2012-12-18 ENCOUNTER — Encounter: Payer: Self-pay | Admitting: Internal Medicine

## 2012-12-18 VITALS — BP 126/80 | HR 80 | Wt 141.0 lb

## 2012-12-18 DIAGNOSIS — M899 Disorder of bone, unspecified: Secondary | ICD-10-CM

## 2012-12-18 DIAGNOSIS — M949 Disorder of cartilage, unspecified: Secondary | ICD-10-CM

## 2012-12-18 DIAGNOSIS — D169 Benign neoplasm of bone and articular cartilage, unspecified: Secondary | ICD-10-CM

## 2012-12-18 DIAGNOSIS — M8580 Other specified disorders of bone density and structure, unspecified site: Secondary | ICD-10-CM

## 2012-12-18 MED ORDER — ALENDRONATE SODIUM 70 MG PO TABS: 70.0000 mg | ORAL_TABLET | ORAL | Status: DC

## 2012-12-18 NOTE — Progress Notes (Signed)
  Subjective:    Patient ID: Jenny Ford, female    DOB: [birth year], 62 y.o.   MRN: [id number]  HPI  She's here to followup the serial bone density results from 2004-2013. This reveals dramatic improvement in the lumbar spine. Significant osteopenia remains at the femoral neck, but this is essentially stable to possibly slightly worse (-1.9 to -2.2). She has never taken bone building therapy. She is on approximately 1400 international units of vitamin D daily. Her vitamin D level was therapeutic at 67.  She walks 3-4 times per week at least 10 miles per day in addition to climbing stairs.  The oral surgeon has no concerns about the maxillary osteoma; no treatment was recommended.  There is family history of osteoporosis in her mother. She has no history of fractures.    Review of Systems   She denies dyspepsia, dysphagia, abdominal pain, or unexplained weight loss.     Objective:   Physical Exam  She appears healthy and well-nourished; she appears younger than stated age.  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  She has no significant hand arthritic changes. There is a raised area at the base of the left thumb and first finger related to prior trauma. Osteoma formation is suggested.  Has no lymphadenopathy about the neck or axilla.       Assessment & Plan:

## 2012-12-18 NOTE — Patient Instructions (Addendum)
Recommended lifestyle interventions to prevent Osteoporosis include calcium 1200 mg a day  & vitamin D3 supplementation to keep vit D  level @ least 40-60. Annual vitamin D level monitor & BMD every 25 months. Also weight bearing exercise such as  walking 30-45 minutes 3-4  X per week is recommended.

## 2013-04-02 ENCOUNTER — Telehealth: Payer: Self-pay | Admitting: *Deleted

## 2013-04-02 ENCOUNTER — Encounter: Payer: Self-pay | Admitting: Internal Medicine

## 2013-04-02 ENCOUNTER — Ambulatory Visit (INDEPENDENT_AMBULATORY_CARE_PROVIDER_SITE_OTHER): Payer: BC Managed Care – PPO | Admitting: Internal Medicine

## 2013-04-02 VITALS — BP 122/78 | HR 89 | Temp 98.5°F | Wt 141.4 lb

## 2013-04-02 DIAGNOSIS — R51 Headache: Secondary | ICD-10-CM

## 2013-04-02 DIAGNOSIS — R300 Dysuria: Secondary | ICD-10-CM

## 2013-04-02 DIAGNOSIS — R519 Headache, unspecified: Secondary | ICD-10-CM

## 2013-04-02 DIAGNOSIS — R509 Fever, unspecified: Secondary | ICD-10-CM

## 2013-04-02 LAB — CBC WITH DIFFERENTIAL/PLATELET
Basophils Relative: 0.1 % (ref 0.0–3.0)
Eosinophils Relative: 4.4 % (ref 0.0–5.0)
HCT: 39.7 % (ref 36.0–46.0)
Lymphs Abs: 0.3 10*3/uL — ABNORMAL LOW (ref 0.7–4.0)
MCHC: 33.4 g/dL (ref 30.0–36.0)
MCV: 94 fl (ref 78.0–100.0)
Monocytes Absolute: 0.7 10*3/uL (ref 0.1–1.0)
Platelets: 225 10*3/uL (ref 150.0–400.0)
WBC: 4.6 10*3/uL (ref 4.5–10.5)

## 2013-04-02 LAB — POCT URINALYSIS DIPSTICK
Blood, UA: NEGATIVE
Ketones, UA: NEGATIVE
Protein, UA: NEGATIVE
Spec Grav, UA: 1.01

## 2013-04-02 NOTE — Patient Instructions (Addendum)
NSAIDS ( Aleve, Advil, Naproxen) or Tylenol every 4 hrs as needed for fever as discussed based on label recommendations. Drink as much nondairy fluids as possible.  Avoid spicy foods or alcohol as  these may aggravate the bladder. Do not take decongestants. Avoid narcotics if possible.

## 2013-04-02 NOTE — Progress Notes (Signed)
Subjective:    Patient ID: Jenny Ford, female    DOB: [birth year], 62 y.o.   MRN: [id number]  HPI  Symptoms began 03/29/13 as malaise and suprapubic pressure & terminal pain with urination. She also had a slight headache.  As of 7/20 the headache worsened and was across the frontal aspect of the head. Tylenol was of some benefit. She started a prescription of generic Septra DS which had been prescribed the previous year her gynecologist but not taken  Malaise increased as of 7/21; she also temperature up to 101.9. She described chills without sweats  She had aching and weakness in her legs.     Review of Systems    At this time she is not having dysuria, pyuria, or hematuria. The frontal headache has improved. She has not had associated otic pain, otic discharge, facial pain, nasal purulence, dental pain, or sore throat.  She has taken some Claritin; she has not had itchy, watery eyes, or sneezing.  She is not had cough or sputum production. She also denies rash or tick exposure.   Constipation is also an issue; no diarrhea with present illness     Objective:   Physical Exam Gen.: Healthy and well-nourished in appearance. Alert, appropriate and cooperative throughout exam. Head: Normocephalic without obvious abnormalities Eyes: No corneal or conjunctival inflammation noted. No icterus Ears: External  ear exam reveals no significant lesions or deformities. Canals clear .TMs normal.  Nose: External nasal exam reveals no deformity or inflammation. Nasal mucosa are pink and moist. No lesions or exudates noted.   Mouth: Oral mucosa and oropharynx reveal no lesions or exudates. Teeth in good repair. Osteoma R maxilla Neck: No deformities, masses, or tenderness noted. Range of motion normal. Lungs: Normal respiratory effort; chest expands symmetrically. Lungs are clear to auscultation without rales, wheezes, or increased work of breathing. Heart: Normal rate and rhythm. Normal S1 and  S2. No gallop, click, or rub.No murmur. Abdomen: Bowel sounds normal; abdomen soft and nontender. No masses, organomegaly or hernias noted.                              Musculoskeletal/extremities: No deformity or scoliosis noted of  the thoracic or lumbar spine.  No clubbing, cyanosis, edema, or significant extremity  deformity noted. Range of motion normal .Tone & strength  Normal. Joints normal . Nail health good. Able to lie down & sit up w/o help. Negative SLR bilaterally Vascular: Carotid, radial artery, dorsalis pedis and  posterior tibial pulses are full and equal. No bruits present. Neurologic: Alert and oriented x3.No meningeal signs Gait normal  including heel & toe walking .        Skin: Intact without suspicious lesions or rashes. Lymph: No cervical, axillary lymphadenopathy present. Psych: Mood and affect are normal. Normally interactive                                                                                        Assessment & Plan:  #1dysuria; S/P 4 days of Septra DS #2 fever  #3 frontal headache w/o nasal purulence Plan: see orders

## 2013-04-02 NOTE — Telephone Encounter (Signed)
Patient called about blood test that she had done today.  She stated that Dr. Alwyn Ren wanted to speak with her about possible medication change per lab results.  Please Advise.

## 2013-04-03 ENCOUNTER — Telehealth: Payer: Self-pay

## 2013-04-03 ENCOUNTER — Other Ambulatory Visit: Payer: Self-pay | Admitting: Internal Medicine

## 2013-04-03 DIAGNOSIS — R300 Dysuria: Secondary | ICD-10-CM

## 2013-04-03 DIAGNOSIS — R509 Fever, unspecified: Secondary | ICD-10-CM

## 2013-04-03 MED ORDER — DOXYCYCLINE HYCLATE 100 MG PO TABS: 100.0000 mg | ORAL_TABLET | Freq: Two times a day (BID) | ORAL | Status: DC

## 2013-04-03 NOTE — Telephone Encounter (Signed)
Patient called to inquire about labs. Patient states that she is febrile at 100.8 this morning. Best# [id number] Please advise your recommendations. GF/RN

## 2013-04-15 NOTE — Telephone Encounter (Signed)
Entered by Pecola Lawless, MD at 04/03/2013 11:07 AM Read by Malissa Hippo at 04/03/2013 11:34 AM The low normal white count and minimally increased monocytes suggest an atypical or nonbacterial infection. I recommend stopping the Septra DS and taking doxycycline 100 mg twice a day. Avoid direct sun while on this medication    Rx was sent and patient has reviewed mychart message.    KP//cma

## 2013-05-26 ENCOUNTER — Ambulatory Visit
Admission: RE | Admit: 2013-05-26 | Discharge: 2013-05-26 | Disposition: A | Discharge status: Home / Self Care | Payer: BC Managed Care – PPO | Source: Ambulatory Visit | Attending: Endocrinology | Admitting: Endocrinology

## 2013-05-26 DIAGNOSIS — E049 Nontoxic goiter, unspecified: Secondary | ICD-10-CM

## 2013-07-17 ENCOUNTER — Other Ambulatory Visit: Payer: Self-pay | Admitting: Internal Medicine

## 2013-07-17 ENCOUNTER — Other Ambulatory Visit: Payer: Self-pay

## 2013-07-17 NOTE — Telephone Encounter (Signed)
Amlodipine refill sent to pharmacy 

## 2013-07-24 ENCOUNTER — Other Ambulatory Visit: Payer: Self-pay | Admitting: Internal Medicine

## 2013-07-24 NOTE — Telephone Encounter (Signed)
Ramipril refilled. 

## 2013-08-01 ENCOUNTER — Telehealth: Payer: Self-pay | Admitting: Internal Medicine

## 2013-08-01 NOTE — Telephone Encounter (Signed)
Patient is calling upset she states that she has left two Vm's on our triage line this week and has not heard back. She states that she needs to discuss her medications. Please advise.

## 2013-08-04 ENCOUNTER — Telehealth: Payer: Self-pay | Admitting: *Deleted

## 2013-08-04 NOTE — Telephone Encounter (Signed)
Spoke with pt and made aware of Dr. Frederik Pear recommendations to hold all bone building therapies at this time. Patient encouraged to continue with weight bearing exercises in the meantime.

## 2013-08-04 NOTE — Telephone Encounter (Signed)
Spoke with pt and apologized that her message was not handled in a more timely manner. Patient is calling because she began Fosamax in April of this year but saw her oral surgeon who advised her to stop taking it in September. (pt has bilobed osteoma of hard R palate) She did stop it and is now concerned that she is not treating the osteo other than the Ca and Vit D. Do you want her to begin another medication to treat osteo?  (T score -2.2) Please advise and I will call the patient.

## 2013-08-04 NOTE — Telephone Encounter (Signed)
She should hold on all bone building therapies until all dental completed for @ least 6 months.

## 2013-08-05 NOTE — Telephone Encounter (Signed)
Pt has been made aware of the note below.//AB/CMA 

## 2013-09-02 ENCOUNTER — Telehealth: Payer: Self-pay

## 2013-09-02 NOTE — Telephone Encounter (Addendum)
Left message for call back Non identifiable  Medication and allergies:  Reviewed and updated  90 day supply/mail order: na Local pharmacy: [facility]    Immunizations due:  Declines flu vaccine  A/P:   No changes to FH or PSH or personal  [facility]--Dr Greta Doom gyn [facility]--11/2012--neg Dexa--08/2012 [facility]--2006-- Dr Clelia Croft due 2016  To Discuss with Provider: Not at this time

## 2013-09-05 ENCOUNTER — Encounter: Payer: Self-pay | Admitting: Internal Medicine

## 2013-09-05 ENCOUNTER — Ambulatory Visit (INDEPENDENT_AMBULATORY_CARE_PROVIDER_SITE_OTHER): Payer: BC Managed Care – PPO | Admitting: Internal Medicine

## 2013-09-05 VITALS — BP 119/76 | HR 78 | Temp 97.8°F | Resp 16 | Ht 64.0 in | Wt 146.0 lb

## 2013-09-05 DIAGNOSIS — M899 Disorder of bone, unspecified: Secondary | ICD-10-CM

## 2013-09-05 DIAGNOSIS — Z0000 Encounter for general adult medical examination without abnormal findings: Secondary | ICD-10-CM

## 2013-09-05 DIAGNOSIS — E785 Hyperlipidemia, unspecified: Secondary | ICD-10-CM

## 2013-09-05 DIAGNOSIS — M8580 Other specified disorders of bone density and structure, unspecified site: Secondary | ICD-10-CM

## 2013-09-05 LAB — HEPATIC FUNCTION PANEL
Albumin: 4.4 g/dL (ref 3.5–5.2)
Total Protein: 7.7 g/dL (ref 6.0–8.3)

## 2013-09-05 LAB — LIPID PANEL
HDL: 46.5 mg/dL (ref >39.00)
Total CHOL/HDL Ratio: 5
Triglycerides: 197 mg/dL — ABNORMAL HIGH (ref 0.0–149.0)
VLDL: 39.4 mg/dL (ref 0.0–40.0)

## 2013-09-05 LAB — BASIC METABOLIC PANEL
CO2: 30 mEq/L (ref 19–32)
Calcium: 9.3 mg/dL (ref 8.4–10.5)
Creatinine, Ser: 0.7 mg/dL (ref 0.4–1.2)
Glucose, Bld: 82 mg/dL (ref 70–99)

## 2013-09-05 LAB — LDL CHOLESTEROL, DIRECT: Direct LDL: 180.1 mg/dL

## 2013-09-05 NOTE — Progress Notes (Signed)
   Subjective:    Patient ID: Jenny Ford, female    DOB: Apr 23, 1951, 62 y.o.   MRN: [id number]  HPI  She  is here for a physical;acute issues denied.     Review of Systems A heart healthy diet is followed; exercise encompasses 30-40 minutes 4-5  times per week as stairs , cycle & calisthentics without symptoms.  Family history is positive for premature coronary disease in 1 M uncle. Advanced cholesterol testing reveals  LDL goal is less than 140 ; ideally < 110 . No statin to date.  Low dose ASA taken Specifically denied are  chest pain, palpitations, dyspnea, or claudication.       Objective:   Physical Exam Gen.: Healthy and well-nourished in appearance. Alert, appropriate and cooperative throughout exam.Appears younger than stated age  Head: Normocephalic without obvious abnormalities  Eyes: No corneal or conjunctival inflammation noted. Pupils equal round reactive to light and accommodation. Extraocular motion intact.  Ears: External  ear exam reveals no significant lesions or deformities. Canals clear .TMs normal. Hearing is grossly normal bilaterally. Nose: External nasal exam reveals no deformity or inflammation. Nasal mucosa are pink and moist. No lesions or exudates noted.   Mouth: Oral mucosa and oropharynx reveal no lesions or exudates. Teeth in good repair. Neck: No deformities, masses, or tenderness noted. Range of motion normal. Thyroid : firm R lobe . Lungs: Normal respiratory effort; chest expands symmetrically. Lungs are clear to auscultation without rales, wheezes, or increased work of breathing. Heart: Normal rate and rhythm. Normal S1 and S2. No gallop, click, or rub. S4 w/o murmur. Abdomen: Bowel sounds normal; abdomen soft and nontender. No masses, organomegaly or hernias noted. Genitalia:  as per Gyn                                  Musculoskeletal/extremities: No deformity or scoliosis noted of  the thoracic or lumbar spine.   No clubbing, cyanosis, edema,  or significant extremity  deformity noted. Range of motion normal .Tone & strength normal. Hand joints normal . Fingernail health good. Able to lie down & sit up w/o help. Negative SLR bilaterally Vascular: Carotid, radial artery, dorsalis pedis and  posterior tibial pulses are full and equal. No bruits present. Neurologic: Alert and oriented x3. Deep tendon reflexes symmetrical and normal.        Skin: Intact without suspicious lesions or rashes. Lymph: No cervical, axillary lymphadenopathy present. Psych: Mood and affect are normal. Normally interactive                                                                                        Assessment & Plan:  #1 comprehensive physical exam; no acute findings  Plan: see Orders  & Recommendations

## 2013-09-05 NOTE — Progress Notes (Signed)
Pre-visit discussion using our clinic review tool. No additional management support is needed unless otherwise documented below in the visit note.  

## 2013-09-05 NOTE — Patient Instructions (Signed)
Your next office appointment will be determined based upon review of your pending labs . Those instructions will be transmitted to you through My Chart  or by mail if you're not using this system.   

## 2013-11-04 ENCOUNTER — Other Ambulatory Visit (HOSPITAL_COMMUNITY): Payer: Self-pay | Admitting: Gynecology

## 2013-11-04 DIAGNOSIS — Z1231 Encounter for screening mammogram for malignant neoplasm of breast: Secondary | ICD-10-CM

## 2013-11-25 ENCOUNTER — Ambulatory Visit (HOSPITAL_COMMUNITY): Payer: BC Managed Care – PPO

## 2013-11-26 ENCOUNTER — Ambulatory Visit (HOSPITAL_COMMUNITY)
Admission: RE | Admit: 2013-11-26 | Discharge: 2013-11-26 | Disposition: A | Discharge status: Home / Self Care | Payer: BC Managed Care – PPO | Source: Ambulatory Visit | Attending: Gynecology | Admitting: Gynecology

## 2013-11-26 DIAGNOSIS — Z1231 Encounter for screening mammogram for malignant neoplasm of breast: Secondary | ICD-10-CM

## 2014-02-03 ENCOUNTER — Other Ambulatory Visit: Payer: Self-pay

## 2014-02-03 MED ORDER — RAMIPRIL 10 MG PO CAPS: ORAL_CAPSULE | ORAL | Status: DC

## 2014-04-17 ENCOUNTER — Other Ambulatory Visit: Payer: Self-pay

## 2014-04-17 MED ORDER — AMLODIPINE BESYLATE 5 MG PO TABS: ORAL_TABLET | ORAL | Status: DC

## 2014-06-10 ENCOUNTER — Telehealth: Payer: Self-pay | Admitting: *Deleted

## 2014-06-10 NOTE — Telephone Encounter (Signed)
Notified pt with md response.../lmb 

## 2014-06-10 NOTE — Telephone Encounter (Signed)
   Fosamax should be discontinued if oral surgery is contemplated. It should not be restarted until cleared by the oral surgeon.

## 2014-06-10 NOTE — Telephone Encounter (Signed)
Pt states she is thinking about going ahead and having the Bilobed Osteoma removed with oral surgeon, but wanting to know if she does can she start back taking fosamax or would it eve matter. Also she stated she been trying to do exercise & eat food that can strength her bones haven't had a bone density requesting order to have done...Johny Chess

## 2014-07-01 ENCOUNTER — Other Ambulatory Visit: Payer: Self-pay | Admitting: Endocrinology

## 2014-07-01 DIAGNOSIS — E049 Nontoxic goiter, unspecified: Secondary | ICD-10-CM

## 2014-08-10 ENCOUNTER — Other Ambulatory Visit: Payer: Self-pay

## 2014-08-10 MED ORDER — RAMIPRIL 10 MG PO CAPS: ORAL_CAPSULE | ORAL | Status: DC

## 2014-09-07 ENCOUNTER — Ambulatory Visit (INDEPENDENT_AMBULATORY_CARE_PROVIDER_SITE_OTHER): Payer: BC Managed Care – PPO | Admitting: Internal Medicine

## 2014-09-07 ENCOUNTER — Ambulatory Visit (INDEPENDENT_AMBULATORY_CARE_PROVIDER_SITE_OTHER)
Admission: RE | Admit: 2014-09-07 | Discharge: 2014-09-07 | Disposition: A | Discharge status: Home / Self Care | Payer: BC Managed Care – PPO | Source: Ambulatory Visit | Attending: Internal Medicine | Admitting: Internal Medicine

## 2014-09-07 ENCOUNTER — Encounter: Payer: Self-pay | Admitting: Internal Medicine

## 2014-09-07 ENCOUNTER — Other Ambulatory Visit: Payer: Self-pay | Admitting: Internal Medicine

## 2014-09-07 ENCOUNTER — Other Ambulatory Visit (INDEPENDENT_AMBULATORY_CARE_PROVIDER_SITE_OTHER): Payer: BC Managed Care – PPO

## 2014-09-07 VITALS — BP 130/80 | HR 83 | Temp 98.1°F | Ht 64.25 in | Wt 144.0 lb

## 2014-09-07 DIAGNOSIS — M8580 Other specified disorders of bone density and structure, unspecified site: Secondary | ICD-10-CM

## 2014-09-07 DIAGNOSIS — Z0000 Encounter for general adult medical examination without abnormal findings: Secondary | ICD-10-CM

## 2014-09-07 DIAGNOSIS — Z0189 Encounter for other specified special examinations: Secondary | ICD-10-CM

## 2014-09-07 LAB — CBC WITH DIFFERENTIAL/PLATELET
BASOS ABS: 0 10*3/uL (ref 0.0–0.1)
Basophils Relative: 0.4 % (ref 0.0–3.0)
Eosinophils Absolute: 0.1 10*3/uL (ref 0.0–0.7)
Eosinophils Relative: 2.4 % (ref 0.0–5.0)
HCT: 41.9 % (ref 36.0–46.0)
HEMOGLOBIN: 13.9 g/dL (ref 12.0–15.0)
LYMPHS PCT: 28.2 % (ref 12.0–46.0)
Lymphs Abs: 1.7 10*3/uL (ref 0.7–4.0)
MCHC: 33.2 g/dL (ref 30.0–36.0)
MCV: 92.2 fl (ref 78.0–100.0)
Monocytes Absolute: 0.6 10*3/uL (ref 0.1–1.0)
Monocytes Relative: 10.2 % (ref 3.0–12.0)
Neutro Abs: 3.6 10*3/uL (ref 1.4–7.7)
Neutrophils Relative %: 58.8 % (ref 43.0–77.0)
Platelets: 238 10*3/uL (ref 150.0–400.0)
RBC: 4.54 Mil/uL (ref 3.87–5.11)
RDW: 13.5 % (ref 11.5–15.5)
WBC: 6.2 10*3/uL (ref 4.0–10.5)

## 2014-09-07 LAB — BASIC METABOLIC PANEL
BUN: 16 mg/dL (ref 6–23)
CO2: 29 meq/L (ref 19–32)
Calcium: 9.3 mg/dL (ref 8.4–10.5)
Chloride: 106 mEq/L (ref 96–112)
Creatinine, Ser: 0.8 mg/dL (ref 0.4–1.2)
GFR: 80.52 mL/min (ref >60.00)
GLUCOSE: 84 mg/dL (ref 70–99)
Potassium: 4 mEq/L (ref 3.5–5.1)
Sodium: 141 mEq/L (ref 135–145)

## 2014-09-07 LAB — HEPATIC FUNCTION PANEL
ALT: 20 U/L (ref 0–35)
AST: 26 U/L (ref 0–37)
Albumin: 4.2 g/dL (ref 3.5–5.2)
Alkaline Phosphatase: 86 U/L (ref 39–117)
Bilirubin, Direct: 0 mg/dL (ref 0.0–0.3)
Total Bilirubin: 0.4 mg/dL (ref 0.2–1.2)
Total Protein: 7.5 g/dL (ref 6.0–8.3)

## 2014-09-07 LAB — VITAMIN D 25 HYDROXY (VIT D DEFICIENCY, FRACTURES): VITD: 45.97 ng/mL (ref 30.00–100.00)

## 2014-09-07 NOTE — Progress Notes (Signed)
Pre visit review using our clinic review tool, if applicable. No additional management support is needed unless otherwise documented below in the visit note. 

## 2014-09-07 NOTE — Patient Instructions (Addendum)
Your next office appointment will be determined based upon review of your pending labs &/ or x-rays. Those instructions will be transmitted to you through My Chart  Minimal Blood Pressure Goal= AVERAGE < 140/90;  Ideal is an AVERAGE < 135/85. This AVERAGE should be calculated from @ least 3 BP readings taken @ different times of day on different days of week. You should not respond to isolated BP readings , but rather the AVERAGE for that week .Please bring your  blood pressure cuff to office visits to verify that it is reliable.It  can also be checked against the blood pressure device at the pharmacy. Finger or wrist cuffs are not dependable; an arm cuff is.    The influenza vaccine is recommended as the influenza virus is becoming more virulent with increased risk to all ages, not just the elderly and very young. Over 200 people died of the flu in [place]  in 2014.   As per the Standard of Care , screening Colonoscopy recommended @ 50 & every 5-10 years thereafter . More frequent monitor would be dictated by family history or findings @ Colonoscopy.You are due in 2016.

## 2014-09-07 NOTE — Progress Notes (Signed)
Subjective:    Patient ID: Jenny Ford, female    DOB: 10/06/1950, 63 y.o.   MRN: [id number]  HPI  She is here for a physical;acute issues include some fatigue.  She has a past history of thyroid nodule which has resolved with thyroid replacement. Her thyroid function is monitored by an endocrinologist annually.  She has been compliant with her medications without adverse effects  She's on a heart healthy diet and does not add salt to her food.  Prior advanced cholesterol testing reveals that her LDL goal is less than 140, ideally less than 110. There is isolated premature coronary disease in her family.  Blood pressure averages 125/80.  She walks at least 3 times a week at least 1 mile without cardio pulmonary symptoms  Her last colonoscopy was in 2006; she'll be due next year  She has no active GI symptoms  She is also due for follow-up bone density as she has mild osteopenia.    Review of Systems   Chest pain, palpitations, tachycardia, exertional dyspnea, paroxysmal nocturnal dyspnea, claudication or edema are absent.  Unexplained weight loss, abdominal pain, significant dyspepsia, dysphagia, melena, rectal bleeding, or persistently small caliber stools are denied.     Objective:   Physical Exam  Gen.: Healthy and well-nourished in appearance. Alert, appropriate and cooperative throughout exam. Appears younger than stated age  Head: Normocephalic without obvious abnormalities  Eyes: No corneal or conjunctival inflammation noted. Pupils equal round reactive to light and accommodation. Extraocular motion intact.  Vision grossly normal with /w/o lenses Ears: External  ear exam reveals no significant lesions or deformities. Canals clear .TMs normal. Hearing is grossly normal bilaterally. Nose: External nasal exam reveals no deformity or inflammation. Nasal mucosa are pink and moist. No lesions or exudates noted.   Mouth: Oral mucosa and oropharynx reveal no lesions or  exudates. Teeth in good repair. Neck: No deformities, masses, or tenderness noted. Range of motion &. Thyroid normal. Lungs: Normal respiratory effort; chest expands symmetrically. Lungs are clear to auscultation without rales, wheezes, or increased work of breathing. Heart: Normal rate and rhythm. Normal S1 and S2. No gallop, click, or rub. No murmur. Abdomen: Bowel sounds normal; abdomen soft and nontender. No masses, organomegaly or hernias noted. Genitalia: as per Gyn; she saw Dr Gertie Fey 11/15.                     Musculoskeletal/extremities: No deformity or scoliosis noted of  the thoracic or lumbar spine.  No clubbing, cyanosis, edema, or significant extremity  deformity noted.  Range of motion normal . Tone & strength normal. Hand joints normal  Fingernail / toenail health good. Minor crepitus of knees  Able to lie down & sit up w/o help.  Negative SLR bilaterally Vascular: Carotid, radial artery, dorsalis pedis and  posterior tibial pulses are full and equal. No bruits present. Neurologic: Alert and oriented x3. Deep tendon reflexes symmetrical and normal.  Gait normal.      Skin: Intact without suspicious lesions or rashes. Lymph: No cervical, axillary lymphadenopathy present. Psych: Mood and affect are normal. Normally interactive  Assessment & Plan:   #1 comprehensive physical exam; no acute findings  Plan: see Orders  & Recommendations

## 2014-09-09 LAB — NMR LIPOPROFILE WITH LIPIDS
CHOLESTEROL, TOTAL: 257 mg/dL — AB (ref 100–199)
HDL PARTICLE NUMBER: 30.1 umol/L — AB (ref >=30.5)
HDL Size: 8.8 nm — ABNORMAL LOW (ref >=9.2)
HDL-C: 51 mg/dL (ref >39)
LARGE HDL: 4.1 umol/L — AB (ref >=4.8)
LARGE VLDL-P: 1.6 nmol/L (ref <=2.7)
LDL (calc): 182 mg/dL — ABNORMAL HIGH (ref 0–99)
LDL PARTICLE NUMBER: 2036 nmol/L — AB (ref <1000)
LDL SIZE: 21 nm (ref >=20.8)
LP-IR SCORE: 41 (ref <=45)
SMALL LDL PARTICLE NUMBER: 348 nmol/L (ref <=527)
Triglycerides: 122 mg/dL (ref 0–149)
VLDL Size: 42 nm (ref <=46.6)

## 2014-10-09 ENCOUNTER — Encounter: Payer: Self-pay | Admitting: Internal Medicine

## 2014-10-13 ENCOUNTER — Other Ambulatory Visit: Payer: Self-pay

## 2014-10-13 MED ORDER — AMLODIPINE BESYLATE 5 MG PO TABS: ORAL_TABLET | ORAL | Status: DC

## 2014-10-17 ENCOUNTER — Other Ambulatory Visit: Payer: Self-pay | Admitting: Internal Medicine

## 2014-10-19 ENCOUNTER — Encounter: Payer: Self-pay | Admitting: Internal Medicine

## 2014-10-22 ENCOUNTER — Telehealth: Payer: Self-pay | Admitting: Internal Medicine

## 2014-10-22 NOTE — Telephone Encounter (Signed)
Mix Eucerin 1 part to Cort Aid 1 part and apply  Twice a day to itchy area of skin as needed .Do not apply any antibiotic ointment. Call the Derm back & ask to be put on call in list for cancellations. Derm appointments are booked out for months unfortunately.

## 2014-10-22 NOTE — Telephone Encounter (Signed)
Patient has a spot on her hand that is red and itchy.  She believes that it could be pre cancerous.  She has an appointment with her dermatologist in March.  She wanted to get in with Dr. Linna Darner if he could remove the spot.  If he can not remove the spot she is requesting a referral to a dermatologist who might be able to get her in sooner.

## 2014-10-23 ENCOUNTER — Ambulatory Visit: Payer: Self-pay | Admitting: Internal Medicine

## 2014-10-23 NOTE — Telephone Encounter (Signed)
Phone call to patient and gave her Dr Hopper's response. She states the dermatologist office does not have a cancellation list she just has to check back daily.

## 2014-12-09 ENCOUNTER — Ambulatory Visit (HOSPITAL_COMMUNITY)
Admission: RE | Admit: 2014-12-09 | Discharge: 2014-12-09 | Disposition: A | Discharge status: Home / Self Care | Payer: 59 | Source: Ambulatory Visit | Attending: Obstetrics & Gynecology | Admitting: Obstetrics & Gynecology

## 2014-12-09 ENCOUNTER — Other Ambulatory Visit (HOSPITAL_COMMUNITY): Payer: Self-pay | Admitting: Obstetrics & Gynecology

## 2014-12-09 DIAGNOSIS — Z1231 Encounter for screening mammogram for malignant neoplasm of breast: Secondary | ICD-10-CM

## 2015-04-15 ENCOUNTER — Other Ambulatory Visit: Payer: Self-pay | Admitting: Emergency Medicine

## 2015-04-15 MED ORDER — AMLODIPINE BESYLATE 5 MG PO TABS: ORAL_TABLET | ORAL | Status: DC

## 2015-05-29 ENCOUNTER — Other Ambulatory Visit: Payer: Self-pay | Admitting: Internal Medicine

## 2015-05-31 ENCOUNTER — Other Ambulatory Visit: Payer: Self-pay | Admitting: Emergency Medicine

## 2015-05-31 MED ORDER — RAMIPRIL 10 MG PO CAPS: 10.0000 mg | ORAL_CAPSULE | Freq: Every day | ORAL | Status: DC

## 2015-06-30 ENCOUNTER — Ambulatory Visit
Admission: RE | Admit: 2015-06-30 | Discharge: 2015-06-30 | Disposition: A | Discharge status: Home / Self Care | Payer: 59 | Source: Ambulatory Visit | Attending: Endocrinology | Admitting: Endocrinology

## 2015-06-30 DIAGNOSIS — E049 Nontoxic goiter, unspecified: Secondary | ICD-10-CM

## 2015-07-02 ENCOUNTER — Other Ambulatory Visit: Payer: BC Managed Care – PPO

## 2015-07-08 ENCOUNTER — Other Ambulatory Visit: Payer: Self-pay | Admitting: Endocrinology

## 2015-07-08 DIAGNOSIS — E049 Nontoxic goiter, unspecified: Secondary | ICD-10-CM

## 2015-07-29 ENCOUNTER — Other Ambulatory Visit: Payer: Self-pay | Admitting: Internal Medicine

## 2015-07-30 ENCOUNTER — Other Ambulatory Visit: Payer: Self-pay | Admitting: Emergency Medicine

## 2015-07-30 MED ORDER — AMLODIPINE BESYLATE 5 MG PO TABS: ORAL_TABLET | ORAL | Status: DC

## 2015-07-30 MED ORDER — RAMIPRIL 10 MG PO CAPS: 10.0000 mg | ORAL_CAPSULE | Freq: Every day | ORAL | Status: DC

## 2015-08-16 ENCOUNTER — Encounter: Payer: 59 | Admitting: Internal Medicine

## 2015-09-08 ENCOUNTER — Ambulatory Visit (INDEPENDENT_AMBULATORY_CARE_PROVIDER_SITE_OTHER): Payer: 59 | Admitting: Internal Medicine

## 2015-09-08 ENCOUNTER — Encounter: Payer: 59 | Admitting: Internal Medicine

## 2015-09-08 ENCOUNTER — Encounter: Payer: Self-pay | Admitting: Internal Medicine

## 2015-09-08 VITALS — BP 120/82 | HR 88 | Temp 98.3°F | Resp 20 | Ht 63.5 in | Wt 143.2 lb

## 2015-09-08 DIAGNOSIS — Z0000 Encounter for general adult medical examination without abnormal findings: Secondary | ICD-10-CM | POA: Diagnosis not present

## 2015-09-08 NOTE — Patient Instructions (Signed)
Zicam Melts or Zinc lozenges as per package label for sore throat .  Complementary options to boost immunity include  vitamin C 2000 mg daily; & Echinacea for 4-7 days.   Plain Mucinex (NOT D) for thick secretions ;force NON dairy fluids .   Nasal cleansing in the shower as discussed with lather of mild shampoo.After 10 seconds wash off lather while  exhaling through nostrils. Make sure that all residual soap is removed to prevent irritation.  Flonase OR Nasacort AQ 1 spray in each nostril twice a day as needed. Use the "crossover" technique into opposite nostril spraying toward opposite ear @ 45 degree angle, not straight up into nostril.  Plain Allegra (NOT D )  160 daily , Loratidine 10 mg , OR Zyrtec 10 mg @ bedtime  as needed for itchy eyes & sneezing.   Your next office appointment will be determined based upon review of your pending labs .Those written interpretation of the lab results and instructions will be transmitted to you by My Chart .Critical results will be called.   Followup as needed for any active or acute issue. Please report any significant change in your symptoms.

## 2015-09-08 NOTE — Progress Notes (Signed)
   Subjective:    Patient ID: Jenny Ford, female    DOB: Jul 21, 1951, 64 y.o.   MRN: [id number]  HPI The patient is here for a physical to assess status of active health conditions.  PMH, FH, & Social History reviewed & updated.No change in [place] as recorded.  She's been on a heart healthy diet. Prior to developing rhinitis symptoms in December, she had been walking 5 miles per week without associated cardiopulmonary symptoms.  Blood pressure home range is 125-128/78-80.  [place]o [place] apparently was completed by her gynecologist and was negative. She has no active GI symptoms.  Review of systems is positive for intermittent head congestion and nonproductive cough. She has no other upper history tract infection symptoms.  She has ridging of the nails which is chronic. She also describes nocturia 1. She has cold intolerance. TSH was therapeutic in October by Dr. Chalmers Cater.   Review of Systems  Chest pain, palpitations, tachycardia, exertional dyspnea, paroxysmal nocturnal dyspnea, claudication or edema are absent. No unexplained weight loss, abdominal pain, significant dyspepsia, dysphagia, melena, rectal bleeding, or persistently small caliber stools. Dysuria, pyuria, hematuria, frequency,or polyuria are denied. Change in hair or skin denied. No bowel changes of constipation or diarrhea. No intolerance to heat. Frontal headache, facial pain , nasal purulence, dental pain, sore throat , otic pain or otic discharge denied. No fever , chills or sweats.     Objective:   Physical Exam  Pertinent or positive findings include: Thyroid is asymmetric. No nodules are palpable. There is marked erythema of the nasal mucosa. She has slight crepitus of the knees.  General appearance :adequately nourished; in no distress.  Eyes: No conjunctival inflammation or scleral icterus is present.  Oral exam:  Lips and gums are healthy appearing.There is no oropharyngeal erythema or exudate noted. Dental  hygiene is good.  Heart:  Normal rate and regular rhythm. S1 and S2 normal without gallop, murmur, click, rub or other extra sounds    Lungs:Chest clear to auscultation; no wheezes, rhonchi,rales ,or rubs present.No increased work of breathing.   Abdomen: bowel sounds normal, soft and non-tender without masses, organomegaly or hernias noted.  No guarding or rebound.   Vascular : all pulses equal ; no bruits present.  Skin:Warm & dry.  Intact without suspicious lesions or rashes ; no tenting or jaundice   Lymphatic: No lymphadenopathy is noted about the head, neck, axilla   Neuro: Strength, tone & DTRs normal.     Assessment & Plan:  #1 comprehensive physical exam; no acute findings #2 non allergic rhinitis  Plan: see Orders  & Recommendations

## 2015-09-08 NOTE — Progress Notes (Signed)
Pre visit review using our clinic review tool, if applicable. No additional management support is needed unless otherwise documented below in the visit note. 

## 2015-09-09 ENCOUNTER — Encounter: Payer: 59 | Admitting: Internal Medicine

## 2015-09-09 ENCOUNTER — Other Ambulatory Visit (INDEPENDENT_AMBULATORY_CARE_PROVIDER_SITE_OTHER): Payer: 59

## 2015-09-09 DIAGNOSIS — Z0000 Encounter for general adult medical examination without abnormal findings: Secondary | ICD-10-CM | POA: Diagnosis not present

## 2015-09-09 LAB — BASIC METABOLIC PANEL
BUN: 16 mg/dL (ref 6–23)
CO2: 27 mEq/L (ref 19–32)
Calcium: 9.3 mg/dL (ref 8.4–10.5)
Chloride: 104 mEq/L (ref 96–112)
Creatinine, Ser: 0.82 mg/dL (ref 0.40–1.20)
GFR: 74.64 mL/min (ref >60.00)
Glucose, Bld: 88 mg/dL (ref 70–99)
Potassium: 3.7 mEq/L (ref 3.5–5.1)
Sodium: 141 mEq/L (ref 135–145)

## 2015-09-09 LAB — CBC WITH DIFFERENTIAL/PLATELET
Basophils Absolute: 0 10*3/uL (ref 0.0–0.1)
Basophils Relative: 0.5 % (ref 0.0–3.0)
EOS PCT: 2.7 % (ref 0.0–5.0)
Eosinophils Absolute: 0.2 10*3/uL (ref 0.0–0.7)
HCT: 41.1 % (ref 36.0–46.0)
Hemoglobin: 13.7 g/dL (ref 12.0–15.0)
LYMPHS PCT: 26.4 % (ref 12.0–46.0)
Lymphs Abs: 1.6 10*3/uL (ref 0.7–4.0)
MCHC: 33.3 g/dL (ref 30.0–36.0)
MCV: 92.7 fl (ref 78.0–100.0)
Monocytes Absolute: 0.8 10*3/uL (ref 0.1–1.0)
Monocytes Relative: 13.1 % — ABNORMAL HIGH (ref 3.0–12.0)
NEUTROS PCT: 57.3 % (ref 43.0–77.0)
Neutro Abs: 3.5 10*3/uL (ref 1.4–7.7)
PLATELETS: 243 10*3/uL (ref 150.0–400.0)
RBC: 4.43 Mil/uL (ref 3.87–5.11)
RDW: 12.8 % (ref 11.5–15.5)
WBC: 6.2 10*3/uL (ref 4.0–10.5)

## 2015-09-09 LAB — HEPATIC FUNCTION PANEL
ALT: 21 U/L (ref 0–35)
AST: 25 U/L (ref 0–37)
Albumin: 4 g/dL (ref 3.5–5.2)
Alkaline Phosphatase: 94 U/L (ref 39–117)
BILIRUBIN TOTAL: 0.3 mg/dL (ref 0.2–1.2)
Bilirubin, Direct: 0 mg/dL (ref 0.0–0.3)
Total Protein: 7.3 g/dL (ref 6.0–8.3)

## 2015-09-09 LAB — LIPID PANEL
CHOLESTEROL: 233 mg/dL — AB (ref 0–200)
HDL: 49.3 mg/dL (ref >39.00)
LDL CALC: 154 mg/dL — AB (ref 0–99)
NonHDL: 184.1
TRIGLYCERIDES: 150 mg/dL — AB (ref 0.0–149.0)
Total CHOL/HDL Ratio: 5
VLDL: 30 mg/dL (ref 0.0–40.0)

## 2015-09-09 LAB — VITAMIN D 25 HYDROXY (VIT D DEFICIENCY, FRACTURES): VITD: 51.22 ng/mL (ref 30.00–100.00)

## 2015-10-23 ENCOUNTER — Other Ambulatory Visit: Payer: Self-pay | Admitting: Internal Medicine

## 2015-10-26 ENCOUNTER — Telehealth: Payer: Self-pay | Admitting: Internal Medicine

## 2015-10-26 ENCOUNTER — Other Ambulatory Visit: Payer: Self-pay

## 2015-10-26 MED ORDER — RAMIPRIL 10 MG PO CAPS: 10.0000 mg | ORAL_CAPSULE | Freq: Every day | ORAL | Status: AC

## 2015-10-26 MED ORDER — AMLODIPINE BESYLATE 5 MG PO TABS: ORAL_TABLET | ORAL | Status: AC

## 2015-10-26 NOTE — Telephone Encounter (Signed)
Pt requesting refills for amLODipine (NORVASC) 5 MG tablet [id number] and ramipril (ALTACE) 10 MG capsule [id number] Pharmacy is [facility] on [address].  Since she just seen Hopper end of Dec,  I went ahead and scheduled her for 6 mos from then.

## 2016-01-26 ENCOUNTER — Other Ambulatory Visit: Payer: Self-pay

## 2016-01-26 DIAGNOSIS — Z1231 Encounter for screening mammogram for malignant neoplasm of breast: Secondary | ICD-10-CM

## 2016-02-10 ENCOUNTER — Ambulatory Visit
Admission: RE | Admit: 2016-02-10 | Discharge: 2016-02-10 | Disposition: A | Discharge status: Home / Self Care | Payer: 59 | Source: Ambulatory Visit

## 2016-02-10 DIAGNOSIS — Z1231 Encounter for screening mammogram for malignant neoplasm of breast: Secondary | ICD-10-CM

## 2016-02-29 ENCOUNTER — Ambulatory Visit: Payer: 59 | Admitting: Internal Medicine

## 2016-04-24 ENCOUNTER — Other Ambulatory Visit: Payer: Self-pay | Admitting: Internal Medicine

## 2016-05-20 ENCOUNTER — Other Ambulatory Visit: Payer: Self-pay | Admitting: Internal Medicine

## 2016-05-27 ENCOUNTER — Other Ambulatory Visit: Payer: Self-pay | Admitting: Internal Medicine

## 2016-07-07 ENCOUNTER — Ambulatory Visit
Admission: RE | Admit: 2016-07-07 | Discharge: 2016-07-07 | Disposition: A | Discharge status: Home / Self Care | Payer: 59 | Source: Ambulatory Visit | Attending: Endocrinology | Admitting: Endocrinology

## 2016-07-07 DIAGNOSIS — E049 Nontoxic goiter, unspecified: Secondary | ICD-10-CM

## 2016-09-21 ENCOUNTER — Telehealth: Payer: Self-pay | Admitting: Cardiology

## 2016-09-21 NOTE — Telephone Encounter (Signed)
Received records from [facility] for appointment on 10/17/16 with Dr Percival Spanish.  Records with Dr Hochrein's schedule for 10/17/16. lp

## 2016-10-17 ENCOUNTER — Ambulatory Visit: Payer: 59 | Admitting: Cardiology

## 2016-10-26 NOTE — Progress Notes (Signed)
Cardiology Office Note   Date:  10/29/2016   ID:  Jenny Ford, DOB 04/21/1951, MRN [id number]  PCP:  Horatio Pel, MD  Cardiologist:   Minus Breeding, MD  Referring:  Horatio Pel, MD  Chief Complaint  Patient presents with  . Abnormal ECG      History of Present Illness: Jenny Ford is a 66 y.o. female who presents for evaluation of an abnormal EKG.  She was noted to have this recently. She's had no past cardiac history. I was able to find a previous EKG from 2013 that demonstrated a borderline very mild interventricular conduction delay. Otherwise she's had no past cardiac history or workup. She denies any symptoms such as chest pressure, neck or arm discomfort. She doesn't really notice any palpitations, presyncope or syncope. She might get short of breath with activities. She does have a significant family history of early coronary artery disease. She does try to walk 5 miles per week but has had increased fatigue.     Past Medical History:  Diagnosis Date  . Hyperlipidemia    LDL 172(1674/444),TG 106  . LDL goal = < 140  . Hypertension   . Thyroid disease    thyroid nodules; Dr.Balan    Past Surgical History:  Procedure Laterality Date  . COLONOSCOPY  2006   Negative; Dr Olevia Perches  . G2 P2    . MOUTH SURGERY     maxillary osteoma resected; Dr Marcelyn Ditty  . septopasty    . tendon tear      L ankle      Current Outpatient Prescriptions  Medication Sig Dispense Refill  . amLODipine (NORVASC) 5 MG tablet TAKE 1 1/2 TABLETS BY MOUTH DAILY GOAL = AVERAGE < 135/85 135 tablet 1  . aspirin EC 81 MG tablet Take 81 mg by mouth daily.    Marland Kitchen BIOTIN PO Take 1 tablet by mouth daily.    . Calcium Carbonate-Vitamin D (CALTRATE 600+D PO) Take 2 tablets by mouth daily.    . Cyanocobalamin (VITAMIN B-12 PO) Take 1 tablet by mouth daily.    . Ergocalciferol (VITAMIN D2) 2000 units TABS Take 1 tablet by mouth daily.    Marland Kitchen Fexofenadine HCl (ALLEGRA PO) Take 1  tablet by mouth daily.    Marland Kitchen levothyroxine (SYNTHROID, LEVOTHROID) 88 MCG tablet Take 88 mcg by mouth daily.      . Multiple Vitamin (MULTIVITAMIN) tablet Take 1 tablet by mouth daily. Vitamins & Calcium     . Omega-3 Fatty Acids (FISH OIL PO) Take 1 capsule by mouth daily.    . ramipril (ALTACE) 10 MG capsule Take 1 capsule (10 mg total) by mouth daily. 90 capsule 1   No current facility-administered medications for this visit.     Allergies:   Fosamax [alendronate sodium]    Social History:  The patient  reports that she has never smoked. She has never used smokeless tobacco. She reports that she does not drink alcohol or use drugs.   Family History:  The patient's family history includes Alzheimer's disease in her mother; Asthma in her father; Breast cancer in her mother; Coronary artery disease (age of onset: 75) in her brother; Coronary artery disease (age of onset: 67) in her mother; Heart attack in her maternal grandmother and maternal uncle; Thyroid nodules in her sister.    ROS:  Please see the history of present illness.   Otherwise, review of systems are positive for none.   All other systems are  reviewed and negative.    PHYSICAL EXAM: VS:  BP 130/77 (BP Location: Right Arm)   Pulse 81   Ht 5\' 4"  (1.626 m)   Wt 142 lb (64.4 kg)   BMI 24.37 kg/m  , BMI Body mass index is 24.37 kg/m. GENERAL:  Well appearing HEENT:  Pupils equal round and reactive, fundi not visualized, oral mucosa unremarkable NECK:  No jugular venous distention, waveform within normal limits, carotid upstroke brisk and symmetric, no bruits, no thyromegaly LYMPHATICS:  No cervical, inguinal adenopathy LUNGS:  Clear to auscultation bilaterally BACK:  No CVA tenderness CHEST:  Unremarkable HEART:  PMI not displaced or sustained,S1 and S2 within normal limits, no S3, no S4, no clicks, no rubs, no murmurs ABD:  Flat, positive bowel sounds normal in frequency in pitch, no bruits, no rebound, no guarding, no  midline pulsatile mass, no hepatomegaly, no splenomegaly EXT:  2 plus pulses throughout, no edema, no cyanosis no clubbing SKIN:  No rashes no nodules NEURO:  Cranial nerves II through XII grossly intact, motor grossly intact throughout PSYCH:  Cognitively intact, oriented to person place and time    EKG:  EKG is ordered today. The ekg ordered today demonstrates LBBB, rate 81    Recent Labs: No results found for requested labs within last 8760 hours.    Lipid Panel    Component Value Date/Time   CHOL 233 (H) 09/09/2015 0734   CHOL 257 (H) 09/07/2014 1020   TRIG 150.0 (H) 09/09/2015 0734   TRIG 122 09/07/2014 1020   HDL 49.30 09/09/2015 0734   HDL 51 09/07/2014 1020   CHOLHDL 5 09/09/2015 0734   VLDL 30.0 09/09/2015 0734   LDLCALC 154 (H) 09/09/2015 0734   LDLCALC 182 (H) 09/07/2014 1020   LDLDIRECT 180.1 09/05/2013 1047      Wt Readings from Last 3 Encounters:  10/27/16 142 lb (64.4 kg)  09/08/15 143 lb 4 oz (65 kg)  09/07/14 144 lb (65.3 kg)      Other studies Reviewed: Additional studies/ records that were reviewed today include: Office records. Review of the above records demonstrates:  Please see elsewhere in the note.     ASSESSMENT AND PLAN:  LBBB:  Given this and her SOB and her significant risk factors she will need a stress test.  Given the baseline abnormal EKG she will need a Lexiscan Myoview.     HTN:  The blood pressure is at target. No change in medications is indicated. We will continue with therapeutic lifestyle changes (TLC).  HYPERLIPIDEMIA:  I will be happy to review her lipids when I can and offer further suggestions.    Current medicines are reviewed at length with the patient today.  The patient does not have concerns regarding medicines.  The following changes have been made:  no change  Labs/ tests ordered today include:   Orders Placed This Encounter  Procedures  . Myocardial Perfusion Imaging  . EKG 12-Lead     Disposition:    FU with me as needed.      Signed, Minus Breeding, MD  10/29/2016 8:37 PM    [place]

## 2016-10-27 ENCOUNTER — Encounter: Payer: Self-pay | Admitting: Cardiology

## 2016-10-27 ENCOUNTER — Ambulatory Visit (INDEPENDENT_AMBULATORY_CARE_PROVIDER_SITE_OTHER): Payer: 59 | Admitting: Cardiology

## 2016-10-27 VITALS — BP 130/77 | HR 81 | Ht 64.0 in | Wt 142.0 lb

## 2016-10-27 DIAGNOSIS — I447 Left bundle-branch block, unspecified: Secondary | ICD-10-CM | POA: Diagnosis not present

## 2016-10-27 DIAGNOSIS — R9431 Abnormal electrocardiogram [ECG] [EKG]: Secondary | ICD-10-CM | POA: Diagnosis not present

## 2016-10-27 DIAGNOSIS — R0602 Shortness of breath: Secondary | ICD-10-CM

## 2016-10-27 NOTE — Patient Instructions (Signed)
Medication Instructions:  Continue current medications  Labwork: None Ordered  Testing/Procedures: Your physician has requested that you have a lexiscan myoview. For further information please visit [email or web address]. Please follow instruction sheet, as given.  Follow-Up: Your physician recommends that you schedule a follow-up appointment in: As Needed   Any Other Special Instructions Will Be Listed Below (If Applicable).   If you need a refill on your cardiac medications before your next appointment, please call your pharmacy.   

## 2016-10-29 ENCOUNTER — Encounter: Payer: Self-pay | Admitting: Cardiology

## 2016-11-09 ENCOUNTER — Encounter (HOSPITAL_COMMUNITY): Payer: 59

## 2016-11-10 ENCOUNTER — Telehealth (HOSPITAL_COMMUNITY): Payer: Self-pay

## 2016-11-10 NOTE — Telephone Encounter (Signed)
Encounter complete. 

## 2016-11-15 ENCOUNTER — Ambulatory Visit (HOSPITAL_COMMUNITY)
Admission: RE | Admit: 2016-11-15 | Discharge: 2016-11-15 | Disposition: A | Discharge status: Home / Self Care | Payer: 59 | Source: Ambulatory Visit | Attending: Cardiovascular Disease | Admitting: Cardiovascular Disease

## 2016-11-15 DIAGNOSIS — R0602 Shortness of breath: Secondary | ICD-10-CM | POA: Insufficient documentation

## 2016-11-15 LAB — MYOCARDIAL PERFUSION IMAGING
CHL CUP NUCLEAR SSS: 14
LV dias vol: 71 mL (ref 46–106)
LV sys vol: 23 mL
Peak HR: 110 {beats}/min
Rest HR: 68 {beats}/min
SDS: 3
SRS: 11
TID: 1.04

## 2016-11-15 MED ORDER — TECHNETIUM TC 99M TETROFOSMIN IV KIT
10.3000 | PACK | Freq: Once | INTRAVENOUS | Status: AC | PRN
  Administered 2016-11-15: 10.3 via INTRAVENOUS
  Filled 2016-11-15: qty 11

## 2016-11-15 MED ORDER — TECHNETIUM TC 99M TETROFOSMIN IV KIT
31.3000 | PACK | Freq: Once | INTRAVENOUS | Status: AC | PRN
  Administered 2016-11-15: 31.3 via INTRAVENOUS
  Filled 2016-11-15: qty 32

## 2016-11-15 MED ORDER — REGADENOSON 0.4 MG/5ML IV SOLN
0.4000 mg | Freq: Once | INTRAVENOUS | Status: AC
  Administered 2016-11-15: 0.4 mg via INTRAVENOUS

## 2016-11-29 ENCOUNTER — Encounter: Payer: Self-pay | Admitting: Dermatology

## 2017-01-24 ENCOUNTER — Other Ambulatory Visit: Payer: Self-pay | Admitting: Endocrinology

## 2017-01-24 DIAGNOSIS — Z1231 Encounter for screening mammogram for malignant neoplasm of breast: Secondary | ICD-10-CM

## 2017-02-12 ENCOUNTER — Ambulatory Visit: Payer: 59

## 2017-02-12 ENCOUNTER — Ambulatory Visit
Admission: RE | Admit: 2017-02-12 | Discharge: 2017-02-12 | Disposition: A | Discharge status: Home / Self Care | Payer: 59 | Source: Ambulatory Visit | Attending: Endocrinology | Admitting: Endocrinology

## 2017-02-12 DIAGNOSIS — Z1231 Encounter for screening mammogram for malignant neoplasm of breast: Secondary | ICD-10-CM

## 2017-06-28 ENCOUNTER — Ambulatory Visit: Payer: 59 | Admitting: Neurology

## 2017-09-24 DIAGNOSIS — I10 Essential (primary) hypertension: Secondary | ICD-10-CM | POA: Diagnosis not present

## 2017-09-24 DIAGNOSIS — E039 Hypothyroidism, unspecified: Secondary | ICD-10-CM | POA: Diagnosis not present

## 2017-09-28 DIAGNOSIS — Z23 Encounter for immunization: Secondary | ICD-10-CM | POA: Diagnosis not present

## 2017-09-28 DIAGNOSIS — I10 Essential (primary) hypertension: Secondary | ICD-10-CM | POA: Diagnosis not present

## 2017-09-28 DIAGNOSIS — Z6825 Body mass index (BMI) 25.0-25.9, adult: Secondary | ICD-10-CM | POA: Diagnosis not present

## 2017-10-08 DIAGNOSIS — T881XXA Other complications following immunization, not elsewhere classified, initial encounter: Secondary | ICD-10-CM | POA: Diagnosis not present

## 2017-10-25 DIAGNOSIS — Z0000 Encounter for general adult medical examination without abnormal findings: Secondary | ICD-10-CM | POA: Diagnosis not present

## 2017-11-26 DIAGNOSIS — E039 Hypothyroidism, unspecified: Secondary | ICD-10-CM | POA: Diagnosis not present

## 2018-01-14 DIAGNOSIS — D229 Melanocytic nevi, unspecified: Secondary | ICD-10-CM | POA: Diagnosis not present

## 2018-01-14 DIAGNOSIS — L300 Nummular dermatitis: Secondary | ICD-10-CM | POA: Diagnosis not present

## 2018-01-14 DIAGNOSIS — C4492 Squamous cell carcinoma of skin, unspecified: Secondary | ICD-10-CM

## 2018-01-14 DIAGNOSIS — D0439 Carcinoma in situ of skin of other parts of face: Secondary | ICD-10-CM | POA: Diagnosis not present

## 2018-01-14 HISTORY — DX: Squamous cell carcinoma of skin, unspecified: C44.92

## 2018-01-30 ENCOUNTER — Other Ambulatory Visit: Payer: Self-pay | Admitting: Obstetrics & Gynecology

## 2018-01-30 DIAGNOSIS — Z1231 Encounter for screening mammogram for malignant neoplasm of breast: Secondary | ICD-10-CM

## 2018-02-20 ENCOUNTER — Ambulatory Visit
Admission: RE | Admit: 2018-02-20 | Discharge: 2018-02-20 | Disposition: A | Discharge status: Home / Self Care | Payer: PPO | Source: Ambulatory Visit | Attending: Obstetrics & Gynecology | Admitting: Obstetrics & Gynecology

## 2018-02-20 DIAGNOSIS — Z1231 Encounter for screening mammogram for malignant neoplasm of breast: Secondary | ICD-10-CM

## 2018-03-04 DIAGNOSIS — M205X1 Other deformities of toe(s) (acquired), right foot: Secondary | ICD-10-CM | POA: Diagnosis not present

## 2018-03-04 DIAGNOSIS — M21961 Unspecified acquired deformity of right lower leg: Secondary | ICD-10-CM | POA: Diagnosis not present

## 2018-03-04 DIAGNOSIS — M79671 Pain in right foot: Secondary | ICD-10-CM | POA: Diagnosis not present

## 2018-03-04 DIAGNOSIS — M21962 Unspecified acquired deformity of left lower leg: Secondary | ICD-10-CM | POA: Diagnosis not present

## 2018-03-07 DIAGNOSIS — D0439 Carcinoma in situ of skin of other parts of face: Secondary | ICD-10-CM | POA: Diagnosis not present

## 2018-03-07 DIAGNOSIS — L400 Psoriasis vulgaris: Secondary | ICD-10-CM | POA: Diagnosis not present

## 2018-03-27 DIAGNOSIS — L089 Local infection of the skin and subcutaneous tissue, unspecified: Secondary | ICD-10-CM | POA: Diagnosis not present

## 2018-05-06 DIAGNOSIS — H0012 Chalazion right lower eyelid: Secondary | ICD-10-CM | POA: Diagnosis not present

## 2018-06-08 ENCOUNTER — Ambulatory Visit (INDEPENDENT_AMBULATORY_CARE_PROVIDER_SITE_OTHER): Payer: PPO

## 2018-06-08 ENCOUNTER — Ambulatory Visit (HOSPITAL_COMMUNITY)
Admission: EM | Admit: 2018-06-08 | Discharge: 2018-06-08 | Disposition: A | Discharge status: Home / Self Care | Payer: PPO | Attending: Family Medicine | Admitting: Family Medicine

## 2018-06-08 ENCOUNTER — Encounter (HOSPITAL_COMMUNITY): Payer: Self-pay | Admitting: Emergency Medicine

## 2018-06-08 DIAGNOSIS — M25532 Pain in left wrist: Secondary | ICD-10-CM

## 2018-06-08 DIAGNOSIS — M25531 Pain in right wrist: Secondary | ICD-10-CM

## 2018-06-08 DIAGNOSIS — S46911A Strain of unspecified muscle, fascia and tendon at shoulder and upper arm level, right arm, initial encounter: Secondary | ICD-10-CM

## 2018-06-08 NOTE — Discharge Instructions (Addendum)
You may use over the counter ibuprofen or acetaminophen as needed.  ° °

## 2018-06-08 NOTE — ED Triage Notes (Signed)
Pt states last night she tripped over her feet and fell forward, landed on bilateral hands. Pt c/o bilateral wrist pain and R shoulder pain.

## 2018-06-17 DIAGNOSIS — H00014 Hordeolum externum left upper eyelid: Secondary | ICD-10-CM | POA: Diagnosis not present

## 2018-06-18 NOTE — ED Provider Notes (Signed)
[facility]   [id number] 06/08/18 Arrival Time: 5284  ASSESSMENT & PLAN:  1. Pain in both wrists   2. Strain of right shoulder, initial encounter     Imaging: Dg Wrist Complete Right  Result Date: 06/08/2018 CLINICAL DATA:  Post fall now with right wrist pain. EXAM: RIGHT WRIST - COMPLETE 3+ VIEW COMPARISON:  None. FINDINGS: No fracture or dislocation. Joint spaces are preserved. No erosions. No evidence of chondrocalcinosis. No definite displacement of the pronator quadratus fat pad. Regional soft tissues appear normal. IMPRESSION: No fracture. If the patient has pain referable to the anatomic snuff box, splinting and a follow-up radiograph in 10 to 14 days is recommended to evaluate for occult scaphoid fracture. Electronically Signed   By: Sandi Mariscal M.D.   On: 06/08/2018 14:42   No fracture seen. As recommended by radiology, she will f/u if not improving over the next 1.5-2 weeks. Prefers ice and OTC analgesics.  Follow-up Information    Deland Pretty, MD.   Specialty:  Internal Medicine Why:  As needed or if you are not seeing improvement over the next several days. Contact information: [address] [place] [address] [phone]           Reviewed expectations re: course of current medical issues. Questions answered. Outlined signs and symptoms indicating need for more acute intervention. Patient verbalized understanding. After Visit Summary given.  SUBJECTIVE: History from: patient. Jenny Ford is a 67 y.o. female who reports tripping last evening and falling forward landing on her hands. Reports persistent mild to moderate pain of her right wrist mainly; some R shoulder soreness. Discomfort has stabilized since beginning; described as aching without radiation. Relieved by: rest. Worsened by: movement. Associated symptoms: none reported. Extremity sensation changes or weakness: none. Self treatment: has not tried OTCs for  relief of pain. History of similar: no  ROS: As per HPI.   OBJECTIVE:  Vitals:   06/08/18 1357  BP: (!) 171/89  Pulse: 73  Resp: 16  Temp: 97.8 F (36.6 C)  SpO2: 97%    General appearance: alert; no distress Extremities: warm and well perfused; symmetrical with no gross deformities; poorly localized tenderness over her right wrist with no swelling and no bruising; wrist ROM is normal but with discomfort; generalized "soreness" of R shoulder without bony tenderness and with FROM CV: brisk extremity capillary refill Skin: warm and dry Neurologic: normal gait; normal symmetric reflexes in all extremities; normal sensation in all extremities Psychological: alert and cooperative; normal mood and affect  Allergies  Allergen Reactions  . Technetium-52m   . Fosamax [Alendronate Sodium]     Taken April - Sept 2014 ( 5 mos). She was concerned about aching in her legs. An oral surgeon expressed concerns about osteonecrosis of the jaw from this medication.    Past Medical History:  Diagnosis Date  . Hyperlipidemia    LDL 172(1674/444),TG 106  . LDL goal = < 140  . Hypertension   . Thyroid disease    thyroid nodules; Dr.Balan   Social History   Socioeconomic History  . Marital status: Married    Spouse name: Not on file  . Number of children: 2  . Years of education: Not on file  . Highest education level: Not on file  Occupational History  . Occupation: SENIOR EXCUTIVE ADMIN [employer]    Employer: [employer]  Social Needs  . Financial resource strain: Not on file  . Food insecurity:    Worry:  Not on file    Inability: Not on file  . Transportation needs:    Medical: Not on file    Non-medical: Not on file  Tobacco Use  . Smoking status: Never Smoker  . Smokeless tobacco: Never Used  Substance and Sexual Activity  . Alcohol use: No  . Drug use: No  . Sexual activity: Not on file  Lifestyle  . Physical activity:    Days per week: Not on file    Minutes per  session: Not on file  . Stress: Not on file  Relationships  . Social connections:    Talks on phone: Not on file    Gets together: Not on file    Attends religious service: Not on file    Active member of club or organization: Not on file    Attends meetings of clubs or organizations: Not on file    Relationship status: Not on file  Other Topics Concern  . Not on file  Social History Narrative  . Not on file   Family History  Problem Relation Age of Onset  . Asthma Father   . Coronary artery disease Mother 75       angina  . Alzheimer's disease Mother   . Breast cancer Mother 30  . Coronary artery disease Brother 48       STENTS > 55;ANXIETY  . Heart attack Maternal Uncle        2 uncles; 1 in his 26s & 1 in 23s  . Heart attack Maternal Grandmother        after 65  . Thyroid nodules Sister        TWIN  . Breast cancer Cousin   . Diabetes Neg Hx   . Stroke Neg Hx    Past Surgical History:  Procedure Laterality Date  . COLONOSCOPY  2006   Negative; Dr Olevia Perches  . G2 P2    . MOUTH SURGERY     maxillary osteoma resected; Dr Marcelyn Ditty  . septopasty    . tendon tear      L ankle       Vanessa Kick, MD 06/22/18 [phone]

## 2018-06-28 DIAGNOSIS — H00014 Hordeolum externum left upper eyelid: Secondary | ICD-10-CM | POA: Diagnosis not present

## 2018-08-21 DIAGNOSIS — E039 Hypothyroidism, unspecified: Secondary | ICD-10-CM | POA: Diagnosis not present

## 2018-08-27 DIAGNOSIS — Z124 Encounter for screening for malignant neoplasm of cervix: Secondary | ICD-10-CM | POA: Diagnosis not present

## 2018-08-27 DIAGNOSIS — Z01419 Encounter for gynecological examination (general) (routine) without abnormal findings: Secondary | ICD-10-CM | POA: Diagnosis not present

## 2018-08-27 DIAGNOSIS — Z6824 Body mass index (BMI) 24.0-24.9, adult: Secondary | ICD-10-CM | POA: Diagnosis not present

## 2018-08-28 DIAGNOSIS — E049 Nontoxic goiter, unspecified: Secondary | ICD-10-CM | POA: Diagnosis not present

## 2018-08-28 DIAGNOSIS — E039 Hypothyroidism, unspecified: Secondary | ICD-10-CM | POA: Diagnosis not present

## 2018-10-04 DIAGNOSIS — E039 Hypothyroidism, unspecified: Secondary | ICD-10-CM | POA: Diagnosis not present

## 2018-10-04 DIAGNOSIS — M899 Disorder of bone, unspecified: Secondary | ICD-10-CM | POA: Diagnosis not present

## 2018-10-04 DIAGNOSIS — M810 Age-related osteoporosis without current pathological fracture: Secondary | ICD-10-CM | POA: Diagnosis not present

## 2018-10-04 DIAGNOSIS — I10 Essential (primary) hypertension: Secondary | ICD-10-CM | POA: Diagnosis not present

## 2018-10-04 DIAGNOSIS — E559 Vitamin D deficiency, unspecified: Secondary | ICD-10-CM | POA: Diagnosis not present

## 2018-10-04 DIAGNOSIS — M8580 Other specified disorders of bone density and structure, unspecified site: Secondary | ICD-10-CM | POA: Diagnosis not present

## 2018-10-09 DIAGNOSIS — M8580 Other specified disorders of bone density and structure, unspecified site: Secondary | ICD-10-CM | POA: Diagnosis not present

## 2018-10-09 DIAGNOSIS — I10 Essential (primary) hypertension: Secondary | ICD-10-CM | POA: Diagnosis not present

## 2018-10-09 DIAGNOSIS — Z0000 Encounter for general adult medical examination without abnormal findings: Secondary | ICD-10-CM | POA: Diagnosis not present

## 2018-10-09 DIAGNOSIS — E785 Hyperlipidemia, unspecified: Secondary | ICD-10-CM | POA: Diagnosis not present

## 2018-10-09 DIAGNOSIS — M810 Age-related osteoporosis without current pathological fracture: Secondary | ICD-10-CM | POA: Diagnosis not present

## 2018-10-09 DIAGNOSIS — I447 Left bundle-branch block, unspecified: Secondary | ICD-10-CM | POA: Diagnosis not present

## 2018-10-09 DIAGNOSIS — E039 Hypothyroidism, unspecified: Secondary | ICD-10-CM | POA: Diagnosis not present

## 2018-11-25 DIAGNOSIS — E039 Hypothyroidism, unspecified: Secondary | ICD-10-CM | POA: Diagnosis not present

## 2019-01-22 ENCOUNTER — Other Ambulatory Visit: Payer: Self-pay | Admitting: Obstetrics & Gynecology

## 2019-01-22 DIAGNOSIS — Z1231 Encounter for screening mammogram for malignant neoplasm of breast: Secondary | ICD-10-CM

## 2019-02-22 ENCOUNTER — Other Ambulatory Visit: Payer: Self-pay

## 2019-02-22 ENCOUNTER — Ambulatory Visit
Admission: RE | Admit: 2019-02-22 | Discharge: 2019-02-22 | Disposition: A | Discharge status: Home / Self Care | Payer: PPO | Source: Ambulatory Visit | Attending: Obstetrics & Gynecology | Admitting: Obstetrics & Gynecology

## 2019-02-22 DIAGNOSIS — Z1231 Encounter for screening mammogram for malignant neoplasm of breast: Secondary | ICD-10-CM

## 2019-03-24 DIAGNOSIS — Z1212 Encounter for screening for malignant neoplasm of rectum: Secondary | ICD-10-CM | POA: Diagnosis not present

## 2019-03-24 DIAGNOSIS — Z1211 Encounter for screening for malignant neoplasm of colon: Secondary | ICD-10-CM | POA: Diagnosis not present

## 2019-04-14 ENCOUNTER — Other Ambulatory Visit: Payer: Self-pay

## 2019-05-20 DIAGNOSIS — H0015 Chalazion left lower eyelid: Secondary | ICD-10-CM | POA: Diagnosis not present

## 2019-05-23 DIAGNOSIS — H0015 Chalazion left lower eyelid: Secondary | ICD-10-CM | POA: Diagnosis not present

## 2019-07-07 ENCOUNTER — Encounter (INDEPENDENT_AMBULATORY_CARE_PROVIDER_SITE_OTHER): Payer: Self-pay

## 2019-07-25 DIAGNOSIS — S40011A Contusion of right shoulder, initial encounter: Secondary | ICD-10-CM | POA: Diagnosis not present

## 2019-08-22 DIAGNOSIS — E039 Hypothyroidism, unspecified: Secondary | ICD-10-CM | POA: Diagnosis not present

## 2019-08-26 DIAGNOSIS — N819 Female genital prolapse, unspecified: Secondary | ICD-10-CM | POA: Diagnosis not present

## 2019-08-26 DIAGNOSIS — L900 Lichen sclerosus et atrophicus: Secondary | ICD-10-CM | POA: Diagnosis not present

## 2019-08-29 DIAGNOSIS — E039 Hypothyroidism, unspecified: Secondary | ICD-10-CM | POA: Diagnosis not present

## 2019-08-29 DIAGNOSIS — E049 Nontoxic goiter, unspecified: Secondary | ICD-10-CM | POA: Diagnosis not present

## 2019-10-09 DIAGNOSIS — I10 Essential (primary) hypertension: Secondary | ICD-10-CM | POA: Diagnosis not present

## 2019-10-09 DIAGNOSIS — E039 Hypothyroidism, unspecified: Secondary | ICD-10-CM | POA: Diagnosis not present

## 2019-10-15 DIAGNOSIS — M8580 Other specified disorders of bone density and structure, unspecified site: Secondary | ICD-10-CM | POA: Diagnosis not present

## 2019-10-15 DIAGNOSIS — I10 Essential (primary) hypertension: Secondary | ICD-10-CM | POA: Diagnosis not present

## 2019-10-15 DIAGNOSIS — I447 Left bundle-branch block, unspecified: Secondary | ICD-10-CM | POA: Diagnosis not present

## 2019-10-15 DIAGNOSIS — Z0000 Encounter for general adult medical examination without abnormal findings: Secondary | ICD-10-CM | POA: Diagnosis not present

## 2019-10-15 DIAGNOSIS — Z8249 Family history of ischemic heart disease and other diseases of the circulatory system: Secondary | ICD-10-CM | POA: Diagnosis not present

## 2019-10-15 DIAGNOSIS — E785 Hyperlipidemia, unspecified: Secondary | ICD-10-CM | POA: Diagnosis not present

## 2019-10-15 DIAGNOSIS — E039 Hypothyroidism, unspecified: Secondary | ICD-10-CM | POA: Diagnosis not present

## 2019-10-15 DIAGNOSIS — M722 Plantar fascial fibromatosis: Secondary | ICD-10-CM | POA: Diagnosis not present

## 2019-12-09 DIAGNOSIS — E785 Hyperlipidemia, unspecified: Secondary | ICD-10-CM | POA: Diagnosis not present

## 2019-12-16 DIAGNOSIS — E782 Mixed hyperlipidemia: Secondary | ICD-10-CM | POA: Diagnosis not present

## 2019-12-16 DIAGNOSIS — I10 Essential (primary) hypertension: Secondary | ICD-10-CM | POA: Diagnosis not present

## 2019-12-16 DIAGNOSIS — E039 Hypothyroidism, unspecified: Secondary | ICD-10-CM | POA: Diagnosis not present

## 2019-12-16 DIAGNOSIS — Z8249 Family history of ischemic heart disease and other diseases of the circulatory system: Secondary | ICD-10-CM | POA: Diagnosis not present

## 2019-12-16 DIAGNOSIS — M810 Age-related osteoporosis without current pathological fracture: Secondary | ICD-10-CM | POA: Diagnosis not present

## 2020-02-02 ENCOUNTER — Other Ambulatory Visit: Payer: Self-pay | Admitting: Obstetrics & Gynecology

## 2020-02-02 DIAGNOSIS — Z1231 Encounter for screening mammogram for malignant neoplasm of breast: Secondary | ICD-10-CM

## 2020-03-03 ENCOUNTER — Other Ambulatory Visit: Payer: Self-pay

## 2020-03-03 ENCOUNTER — Ambulatory Visit
Admission: RE | Admit: 2020-03-03 | Discharge: 2020-03-03 | Disposition: A | Discharge status: Home / Self Care | Payer: PPO | Source: Ambulatory Visit | Attending: Obstetrics & Gynecology | Admitting: Obstetrics & Gynecology

## 2020-03-03 DIAGNOSIS — Z1231 Encounter for screening mammogram for malignant neoplasm of breast: Secondary | ICD-10-CM

## 2020-05-06 ENCOUNTER — Telehealth: Payer: Self-pay | Admitting: Dermatology

## 2020-05-06 NOTE — Telephone Encounter (Signed)
Patient wanted copy of note that Dr. Denna Haggard had wrote for her in the past; copy made and patient will come by office to pick up

## 2020-05-06 NOTE — Telephone Encounter (Signed)
Patient is calling about possible reaction between condition that Dr. Denna Haggard saw her for and medication prescribed by Dr. Susy Manor.  Medication is Technectium.  Patient refused to say more saying that she would discuss with nurse further information.  (Chart # [id number])

## 2020-06-14 DIAGNOSIS — E039 Hypothyroidism, unspecified: Secondary | ICD-10-CM | POA: Diagnosis not present

## 2020-06-14 DIAGNOSIS — M810 Age-related osteoporosis without current pathological fracture: Secondary | ICD-10-CM | POA: Diagnosis not present

## 2020-06-14 DIAGNOSIS — I10 Essential (primary) hypertension: Secondary | ICD-10-CM | POA: Diagnosis not present

## 2020-06-14 DIAGNOSIS — E782 Mixed hyperlipidemia: Secondary | ICD-10-CM | POA: Diagnosis not present

## 2020-06-16 DIAGNOSIS — M25551 Pain in right hip: Secondary | ICD-10-CM | POA: Diagnosis not present

## 2020-06-16 DIAGNOSIS — Z8249 Family history of ischemic heart disease and other diseases of the circulatory system: Secondary | ICD-10-CM | POA: Diagnosis not present

## 2020-06-16 DIAGNOSIS — E039 Hypothyroidism, unspecified: Secondary | ICD-10-CM | POA: Diagnosis not present

## 2020-06-28 DIAGNOSIS — M25551 Pain in right hip: Secondary | ICD-10-CM | POA: Diagnosis not present

## 2020-06-28 DIAGNOSIS — M545 Low back pain, unspecified: Secondary | ICD-10-CM | POA: Diagnosis not present

## 2020-06-29 DIAGNOSIS — S76011D Strain of muscle, fascia and tendon of right hip, subsequent encounter: Secondary | ICD-10-CM | POA: Diagnosis not present

## 2020-06-29 DIAGNOSIS — S335XXD Sprain of ligaments of lumbar spine, subsequent encounter: Secondary | ICD-10-CM | POA: Diagnosis not present

## 2020-07-01 DIAGNOSIS — H0011 Chalazion right upper eyelid: Secondary | ICD-10-CM | POA: Diagnosis not present

## 2020-07-02 DIAGNOSIS — S76011D Strain of muscle, fascia and tendon of right hip, subsequent encounter: Secondary | ICD-10-CM | POA: Diagnosis not present

## 2020-07-02 DIAGNOSIS — S335XXD Sprain of ligaments of lumbar spine, subsequent encounter: Secondary | ICD-10-CM | POA: Diagnosis not present

## 2020-07-07 DIAGNOSIS — S76011D Strain of muscle, fascia and tendon of right hip, subsequent encounter: Secondary | ICD-10-CM | POA: Diagnosis not present

## 2020-07-07 DIAGNOSIS — S335XXD Sprain of ligaments of lumbar spine, subsequent encounter: Secondary | ICD-10-CM | POA: Diagnosis not present

## 2020-07-09 DIAGNOSIS — S76011D Strain of muscle, fascia and tendon of right hip, subsequent encounter: Secondary | ICD-10-CM | POA: Diagnosis not present

## 2020-07-09 DIAGNOSIS — S335XXD Sprain of ligaments of lumbar spine, subsequent encounter: Secondary | ICD-10-CM | POA: Diagnosis not present

## 2020-07-12 DIAGNOSIS — S335XXD Sprain of ligaments of lumbar spine, subsequent encounter: Secondary | ICD-10-CM | POA: Diagnosis not present

## 2020-07-12 DIAGNOSIS — S76011D Strain of muscle, fascia and tendon of right hip, subsequent encounter: Secondary | ICD-10-CM | POA: Diagnosis not present

## 2020-07-30 DIAGNOSIS — M25551 Pain in right hip: Secondary | ICD-10-CM | POA: Diagnosis not present

## 2020-08-20 DIAGNOSIS — E039 Hypothyroidism, unspecified: Secondary | ICD-10-CM | POA: Diagnosis not present

## 2020-08-31 DIAGNOSIS — E782 Mixed hyperlipidemia: Secondary | ICD-10-CM | POA: Diagnosis not present

## 2020-08-31 DIAGNOSIS — M810 Age-related osteoporosis without current pathological fracture: Secondary | ICD-10-CM | POA: Diagnosis not present

## 2020-08-31 DIAGNOSIS — E039 Hypothyroidism, unspecified: Secondary | ICD-10-CM | POA: Diagnosis not present

## 2020-08-31 DIAGNOSIS — M8580 Other specified disorders of bone density and structure, unspecified site: Secondary | ICD-10-CM | POA: Diagnosis not present

## 2020-08-31 DIAGNOSIS — I10 Essential (primary) hypertension: Secondary | ICD-10-CM | POA: Diagnosis not present

## 2020-09-06 DIAGNOSIS — Z01419 Encounter for gynecological examination (general) (routine) without abnormal findings: Secondary | ICD-10-CM | POA: Diagnosis not present

## 2020-09-06 DIAGNOSIS — Z6824 Body mass index (BMI) 24.0-24.9, adult: Secondary | ICD-10-CM | POA: Diagnosis not present

## 2020-10-26 DIAGNOSIS — E039 Hypothyroidism, unspecified: Secondary | ICD-10-CM | POA: Diagnosis not present

## 2020-10-26 DIAGNOSIS — E559 Vitamin D deficiency, unspecified: Secondary | ICD-10-CM | POA: Diagnosis not present

## 2020-12-09 DIAGNOSIS — H52203 Unspecified astigmatism, bilateral: Secondary | ICD-10-CM | POA: Diagnosis not present

## 2020-12-09 DIAGNOSIS — H5213 Myopia, bilateral: Secondary | ICD-10-CM | POA: Diagnosis not present

## 2020-12-09 DIAGNOSIS — H01004 Unspecified blepharitis left upper eyelid: Secondary | ICD-10-CM | POA: Diagnosis not present

## 2020-12-09 DIAGNOSIS — H01001 Unspecified blepharitis right upper eyelid: Secondary | ICD-10-CM | POA: Diagnosis not present

## 2021-01-04 ENCOUNTER — Other Ambulatory Visit: Payer: Self-pay

## 2021-01-04 ENCOUNTER — Encounter: Payer: Self-pay | Admitting: Dermatology

## 2021-01-04 ENCOUNTER — Ambulatory Visit: Payer: PPO | Admitting: Dermatology

## 2021-01-04 DIAGNOSIS — D225 Melanocytic nevi of trunk: Secondary | ICD-10-CM

## 2021-01-04 DIAGNOSIS — Z1283 Encounter for screening for malignant neoplasm of skin: Secondary | ICD-10-CM | POA: Diagnosis not present

## 2021-01-04 DIAGNOSIS — L700 Acne vulgaris: Secondary | ICD-10-CM | POA: Diagnosis not present

## 2021-01-04 DIAGNOSIS — L820 Inflamed seborrheic keratosis: Secondary | ICD-10-CM

## 2021-01-04 DIAGNOSIS — D1801 Hemangioma of skin and subcutaneous tissue: Secondary | ICD-10-CM

## 2021-01-04 DIAGNOSIS — Z85828 Personal history of other malignant neoplasm of skin: Secondary | ICD-10-CM | POA: Diagnosis not present

## 2021-01-04 DIAGNOSIS — D485 Neoplasm of uncertain behavior of skin: Secondary | ICD-10-CM

## 2021-01-04 DIAGNOSIS — L821 Other seborrheic keratosis: Secondary | ICD-10-CM | POA: Diagnosis not present

## 2021-01-04 MED ORDER — ARAZLO 0.045 % EX LOTN: 1.0000 "application " | TOPICAL_LOTION | Freq: Every evening | CUTANEOUS | 0 refills | Status: DC

## 2021-01-04 NOTE — Patient Instructions (Addendum)
Biopsy, Surgery (Curettage) & Surgery (Excision) Aftercare Instructions  1. Okay to remove bandage in 24 hours  2. Wash area with soap and water  3. Apply Vaseline to area twice daily until healed (Not Neosporin)  4. Okay to cover with a Band-Aid to decrease the chance of infection or prevent irritation from clothing; also it's okay to uncover lesion at home.  5. Suture instructions: return to our office in 7-10 or 10-14 days for a nurse visit for suture removal. Variable healing with sutures, if pain or itching occurs call our office. It's okay to shower or bathe 24 hours after sutures are given.  6. The following risks may occur after a biopsy, curettage or excision: bleeding, scarring, discoloration, recurrence, infection (redness, yellow drainage, pain or swelling).  7. For questions, concerns and results call our office at [place] before 4pm & Friday before 3pm. Biopsy results will be available in 1 week.  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  She may look for a nonprescription product called Dermanail at an independent pharmacy or on [address] and use it on the tips of the nail daily after washing for 10 weeks.  Use Arazlo Monday and Friday nightly

## 2021-01-04 NOTE — Progress Notes (Signed)
   Follow-Up Visit   Subjective  Jenny Ford is a 70 y.o. female who presents for the following: Annual Exam (Patient has a list to go over with Dr Denna Haggard, per patient she has history of scc).  General skin examination Location: Change in spots on left shoulder and back Duration:  Quality:  Associated Signs/Symptoms: Modifying Factors:  Severity:  Timing: Context:   Objective  Well appearing patient in no apparent distress; mood and affect are within normal limits. Mid Back       Left Shoulder - Anterior         A full examination was performed including scalp, head, eyes, ears, nose, lips, neck, chest, axillae, abdomen, back, buttocks, bilateral upper extremities, bilateral lower extremities, hands, feet, fingers, toes, fingernails, and toenails. All findings within normal limits unless otherwise noted below.  Areas beneath undergarments not fully examined   Assessment & Plan    Skin exam for malignant neoplasm Head to Toe  Yearly skin check  Neoplasm of uncertain behavior of skin (2) Mid Back  Skin / nail biopsy Type of biopsy: tangential   Informed consent: discussed and consent obtained   Timeout: patient name, date of birth, surgical site, and procedure verified   Procedure prep:  Patient was prepped and draped in usual sterile fashion (Non sterile) Prep type:  Chlorhexidine Anesthesia: the lesion was anesthetized in a standard fashion   Anesthetic:  1% lidocaine w/ epinephrine 1-100,000 local infiltration Instrument used: flexible razor blade   Hemostasis achieved with: ferric subsulfate   Outcome: patient tolerated procedure well   Post-procedure details: wound care instructions given    Specimen 1 - Surgical pathology Differential Diagnosis: R/O BCC vs SCC - cautery after biopsy  Check Margins: No  Left Shoulder - Anterior  Skin / nail biopsy  Specimen 2 - Surgical pathology Differential Diagnosis: R/O ISK - cautery after  biopsy  Check Margins: No  Acne vulgaris Head - Anterior (Face)  Tazarotene (ARAZLO) 0.045 % LOTN - Head - Anterior (Face)  Hemangioma of skin (2) Left Thigh - Anterior; Left Abdomen (side) - Upper  Benign okay to leave.  Seborrheic keratosis Left Thigh - Anterior  Benign okay to leave.      I, Lavonna Monarch, MD, have reviewed all documentation for this visit.  The documentation on 01/04/21 for the exam, diagnosis, procedures, and orders are all accurate and complete.

## 2021-01-15 ENCOUNTER — Encounter: Payer: Self-pay | Admitting: Dermatology

## 2021-02-02 ENCOUNTER — Other Ambulatory Visit: Payer: Self-pay | Admitting: Internal Medicine

## 2021-02-02 DIAGNOSIS — Z1231 Encounter for screening mammogram for malignant neoplasm of breast: Secondary | ICD-10-CM

## 2021-02-14 DIAGNOSIS — E782 Mixed hyperlipidemia: Secondary | ICD-10-CM | POA: Diagnosis not present

## 2021-02-14 DIAGNOSIS — I10 Essential (primary) hypertension: Secondary | ICD-10-CM | POA: Diagnosis not present

## 2021-02-17 DIAGNOSIS — E7800 Pure hypercholesterolemia, unspecified: Secondary | ICD-10-CM | POA: Diagnosis not present

## 2021-02-17 DIAGNOSIS — Z0001 Encounter for general adult medical examination with abnormal findings: Secondary | ICD-10-CM | POA: Diagnosis not present

## 2021-02-17 DIAGNOSIS — E039 Hypothyroidism, unspecified: Secondary | ICD-10-CM | POA: Diagnosis not present

## 2021-02-17 DIAGNOSIS — M8580 Other specified disorders of bone density and structure, unspecified site: Secondary | ICD-10-CM | POA: Diagnosis not present

## 2021-02-17 DIAGNOSIS — I10 Essential (primary) hypertension: Secondary | ICD-10-CM | POA: Diagnosis not present

## 2021-03-08 ENCOUNTER — Telehealth: Payer: Self-pay | Admitting: Dermatology

## 2021-03-08 DIAGNOSIS — L700 Acne vulgaris: Secondary | ICD-10-CM

## 2021-03-08 MED ORDER — ARAZLO 0.045 % EX LOTN
1.0000 | TOPICAL_LOTION | Freq: Every evening | CUTANEOUS | 0 refills | Status: DC
Start: 2021-03-08 — End: 2021-03-29

## 2021-03-08 NOTE — Telephone Encounter (Signed)
Patient calling because she was told to call and request Rx for Arazlo of samples that were given helped. She states that it has helped and she would like Rx sent to [facility] on [address].

## 2021-03-29 ENCOUNTER — Telehealth: Payer: Self-pay | Admitting: *Deleted

## 2021-03-29 ENCOUNTER — Telehealth: Payer: Self-pay | Admitting: Dermatology

## 2021-03-29 DIAGNOSIS — L700 Acne vulgaris: Secondary | ICD-10-CM

## 2021-03-29 MED ORDER — ARAZLO 0.045 % EX LOTN: 1.0000 "application " | TOPICAL_LOTION | Freq: Every evening | CUTANEOUS | 2 refills | Status: AC

## 2021-03-29 NOTE — Telephone Encounter (Signed)
Phone call to patient to let her know we called in the prescription Arazlo she wanted.

## 2021-03-29 NOTE — Telephone Encounter (Signed)
Said ST would call in Rx for med --Arazlo-- that she was given samples of if it worked. It did;  Pharmacy: [facility].

## 2021-04-04 ENCOUNTER — Ambulatory Visit
Admission: RE | Admit: 2021-04-04 | Discharge: 2021-04-04 | Disposition: A | Discharge status: Home / Self Care | Payer: PPO | Source: Ambulatory Visit | Attending: Internal Medicine | Admitting: Internal Medicine

## 2021-04-04 ENCOUNTER — Telehealth: Payer: Self-pay | Admitting: Dermatology

## 2021-04-04 ENCOUNTER — Other Ambulatory Visit: Payer: Self-pay

## 2021-04-04 ENCOUNTER — Other Ambulatory Visit: Payer: Self-pay | Admitting: *Deleted

## 2021-04-04 ENCOUNTER — Other Ambulatory Visit: Payer: Self-pay | Admitting: Dermatology

## 2021-04-04 DIAGNOSIS — Z1231 Encounter for screening mammogram for malignant neoplasm of breast: Secondary | ICD-10-CM | POA: Diagnosis not present

## 2021-04-04 MED ORDER — ARAZLO 0.045 % EX LOTN: 1.0000 "application " | TOPICAL_LOTION | Freq: Every day | CUTANEOUS | 3 refills | Status: AC

## 2021-04-04 NOTE — Telephone Encounter (Signed)
Re sent the Arazlo prescription to patients pharmacy.

## 2021-04-04 NOTE — Telephone Encounter (Signed)
Says pharmacy ([facility]) told her they never got the Rx for Arazlo.

## 2021-08-02 DIAGNOSIS — E7800 Pure hypercholesterolemia, unspecified: Secondary | ICD-10-CM | POA: Diagnosis not present

## 2021-08-02 DIAGNOSIS — E782 Mixed hyperlipidemia: Secondary | ICD-10-CM | POA: Diagnosis not present

## 2021-08-03 DIAGNOSIS — E7800 Pure hypercholesterolemia, unspecified: Secondary | ICD-10-CM | POA: Diagnosis not present

## 2021-08-03 DIAGNOSIS — M810 Age-related osteoporosis without current pathological fracture: Secondary | ICD-10-CM | POA: Diagnosis not present

## 2021-08-10 IMAGING — MG MM DIGITAL SCREENING BILAT W/ TOMO AND CAD
8 series · 8 of 24 positions shown · non-contrast
Comparison: Previous exam(s).

CLINICAL DATA: Screening.

EXAM:
DIGITAL SCREENING BILATERAL MAMMOGRAM WITH TOMOSYNTHESIS AND CAD
TECHNIQUE: Bilateral screening digital craniocaudal and mediolateral oblique
mammograms were obtained. Bilateral screening digital breast
tomosynthesis was performed. The images were evaluated with
computer-aided detection.

[R CC synth-2D]
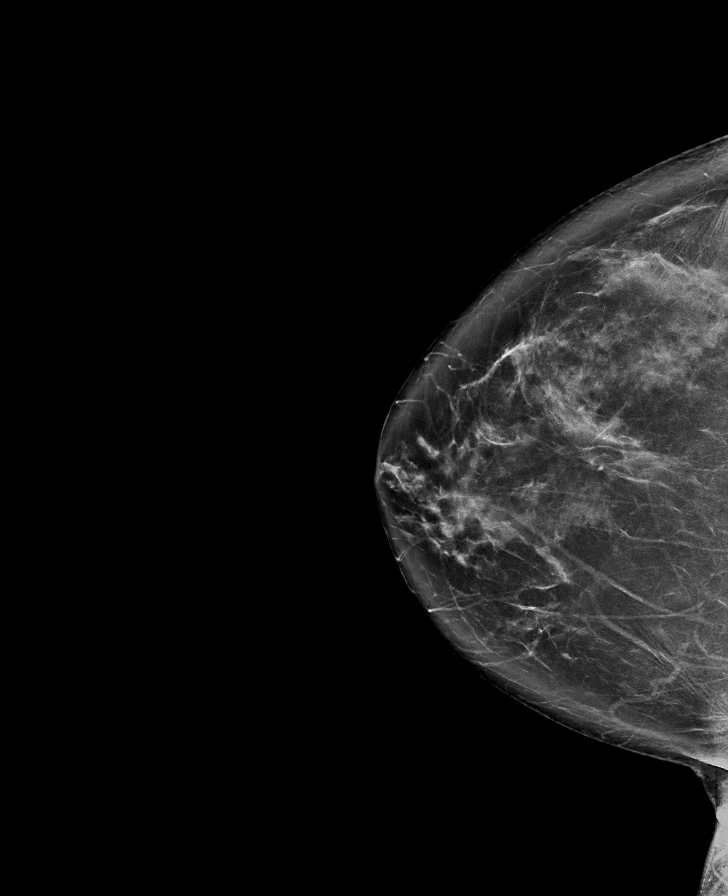

[L MLO synth-2D]
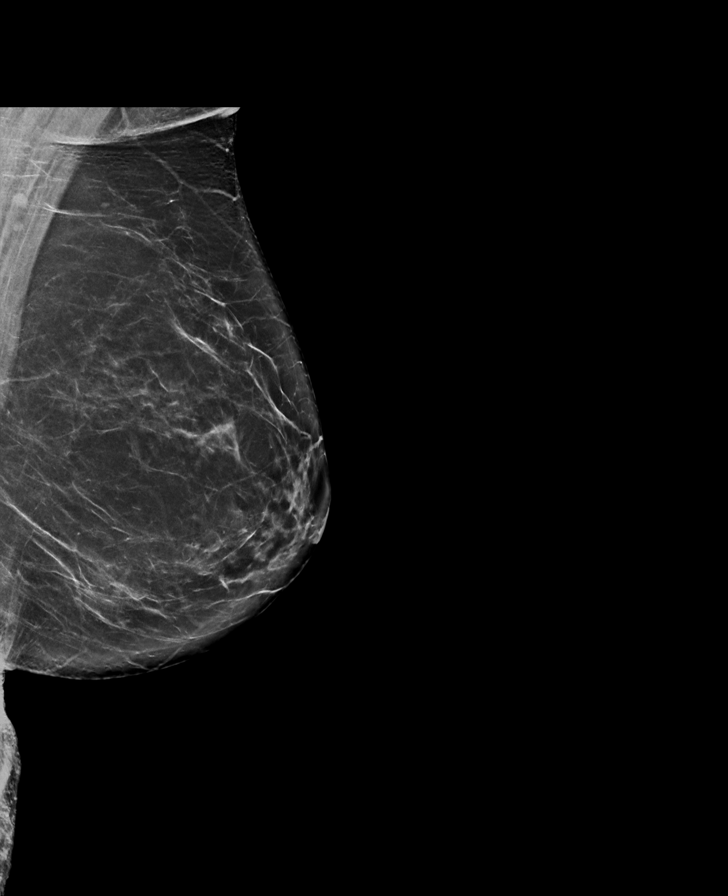

[L CC synth-2D]
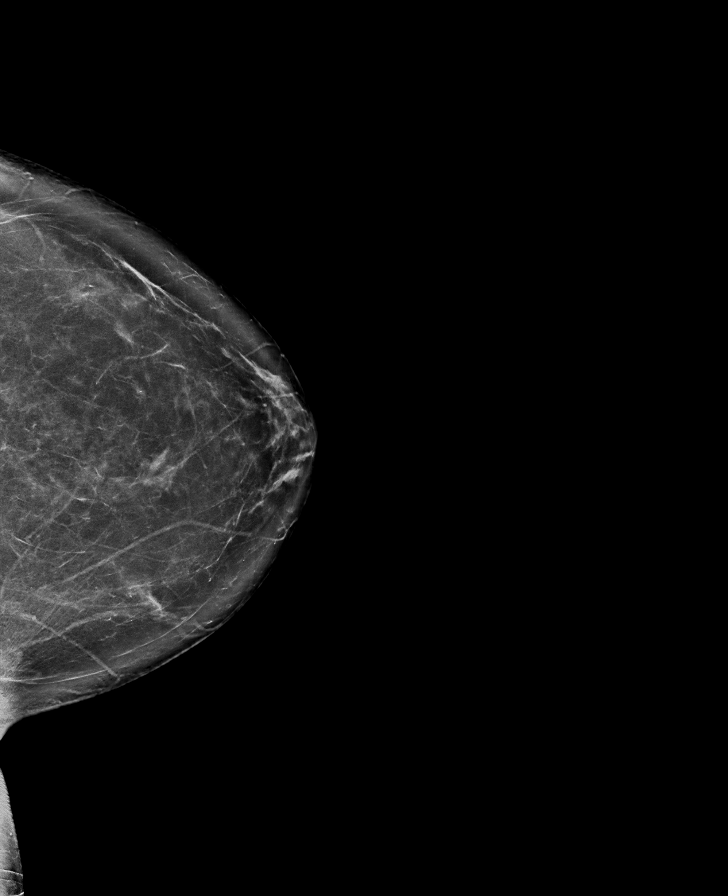

[R MLO synth-2D]
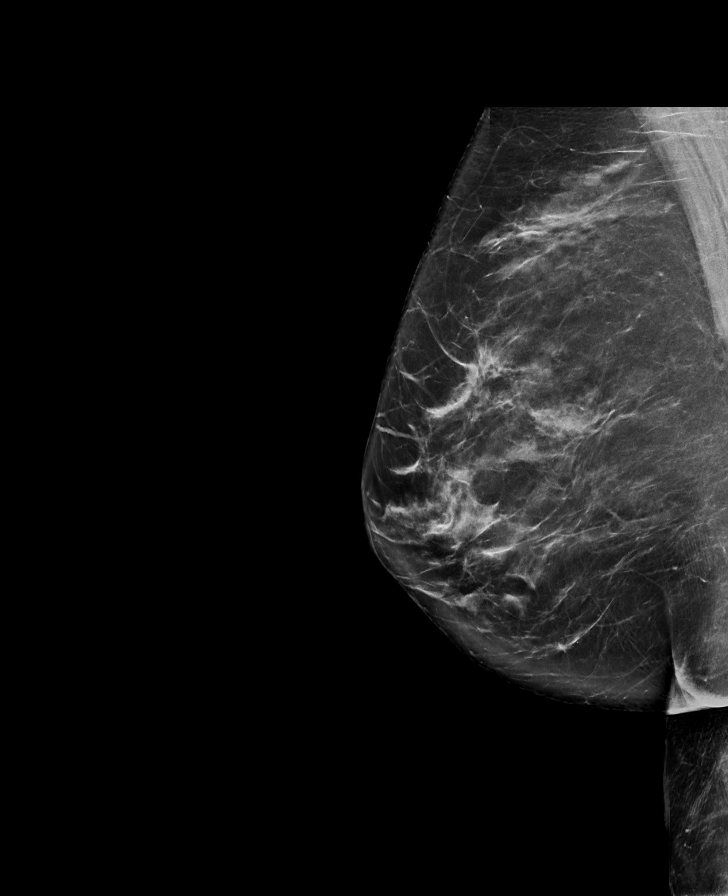

[R CC tomo · tomo slice 43/84.0]
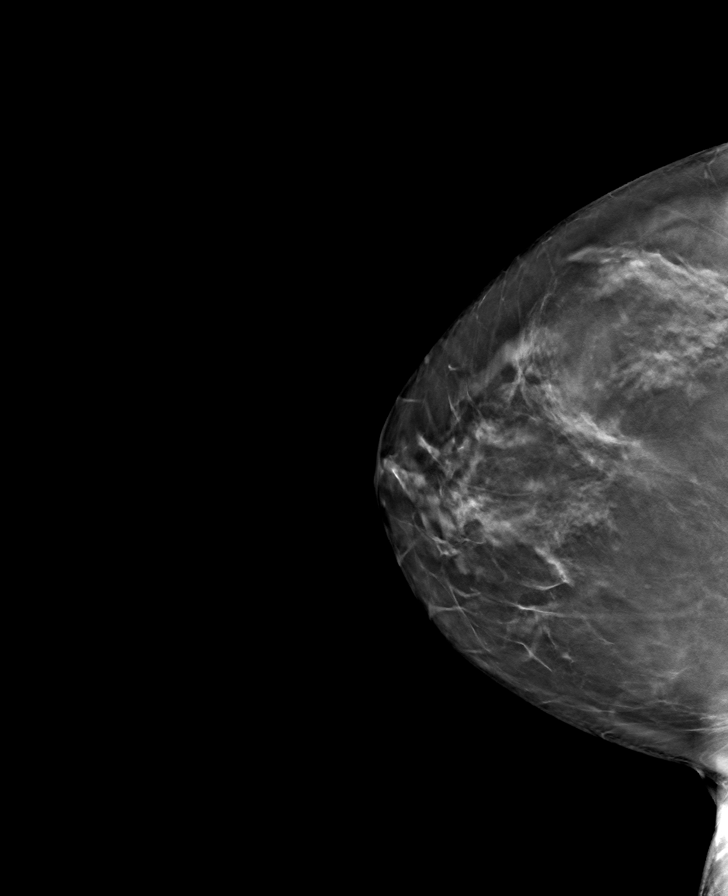

[R MLO tomo · tomo slice 45/88.0]
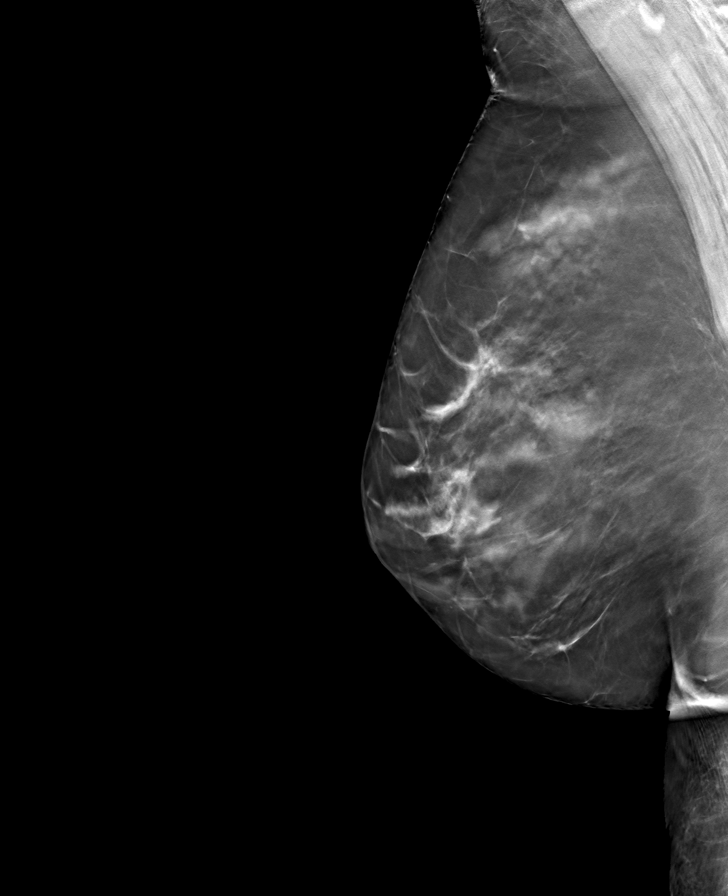

[L CC tomo · tomo slice 43/85.0]
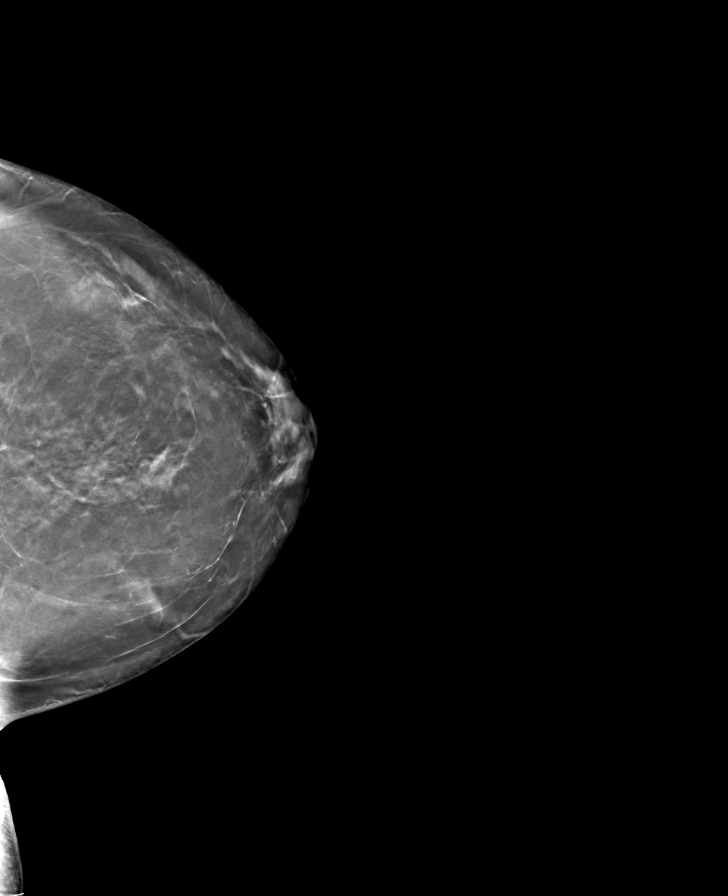

[L MLO tomo · tomo slice 43/86.0]
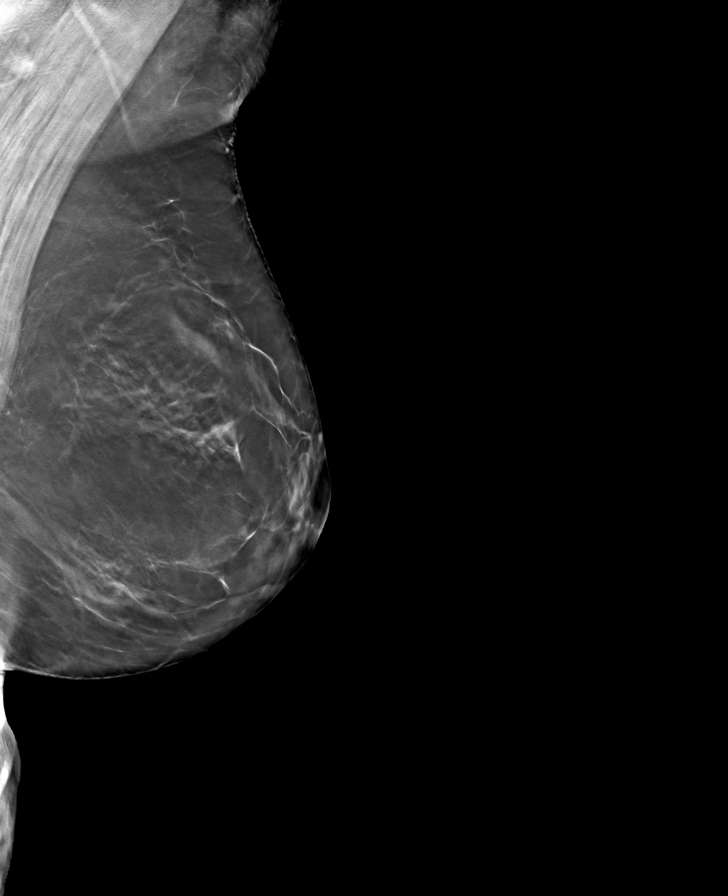

[8 of 24 positions shown; findings below may reference images not displayed]

ACR Breast Density Category b: There are scattered areas of
fibroglandular density.
FINDINGS: There are no findings suspicious for malignancy.
IMPRESSION: No mammographic evidence of malignancy. A result letter of this
screening mammogram will be mailed directly to the patient.

RECOMMENDATION:
Screening mammogram in one year. (Code:51-O-LD2)

BI-RADS CATEGORY  1: Negative.

## 2021-08-24 DIAGNOSIS — E039 Hypothyroidism, unspecified: Secondary | ICD-10-CM | POA: Diagnosis not present

## 2021-08-24 DIAGNOSIS — M810 Age-related osteoporosis without current pathological fracture: Secondary | ICD-10-CM | POA: Diagnosis not present

## 2021-08-24 DIAGNOSIS — E782 Mixed hyperlipidemia: Secondary | ICD-10-CM | POA: Diagnosis not present

## 2021-08-24 DIAGNOSIS — I10 Essential (primary) hypertension: Secondary | ICD-10-CM | POA: Diagnosis not present

## 2021-08-31 DIAGNOSIS — M810 Age-related osteoporosis without current pathological fracture: Secondary | ICD-10-CM | POA: Diagnosis not present

## 2021-08-31 DIAGNOSIS — E785 Hyperlipidemia, unspecified: Secondary | ICD-10-CM | POA: Diagnosis not present

## 2021-08-31 DIAGNOSIS — E782 Mixed hyperlipidemia: Secondary | ICD-10-CM | POA: Diagnosis not present

## 2021-08-31 DIAGNOSIS — E049 Nontoxic goiter, unspecified: Secondary | ICD-10-CM | POA: Diagnosis not present

## 2021-08-31 DIAGNOSIS — E559 Vitamin D deficiency, unspecified: Secondary | ICD-10-CM | POA: Diagnosis not present

## 2021-08-31 DIAGNOSIS — M8580 Other specified disorders of bone density and structure, unspecified site: Secondary | ICD-10-CM | POA: Diagnosis not present

## 2021-08-31 DIAGNOSIS — E039 Hypothyroidism, unspecified: Secondary | ICD-10-CM | POA: Diagnosis not present

## 2021-08-31 DIAGNOSIS — I10 Essential (primary) hypertension: Secondary | ICD-10-CM | POA: Diagnosis not present

## 2021-12-08 DIAGNOSIS — E782 Mixed hyperlipidemia: Secondary | ICD-10-CM | POA: Diagnosis not present

## 2021-12-12 DIAGNOSIS — H01001 Unspecified blepharitis right upper eyelid: Secondary | ICD-10-CM | POA: Diagnosis not present

## 2021-12-12 DIAGNOSIS — H40013 Open angle with borderline findings, low risk, bilateral: Secondary | ICD-10-CM | POA: Diagnosis not present

## 2021-12-12 DIAGNOSIS — H5213 Myopia, bilateral: Secondary | ICD-10-CM | POA: Diagnosis not present

## 2021-12-12 DIAGNOSIS — H2513 Age-related nuclear cataract, bilateral: Secondary | ICD-10-CM | POA: Diagnosis not present

## 2022-01-09 ENCOUNTER — Encounter: Payer: Self-pay | Admitting: Dermatology

## 2022-01-09 ENCOUNTER — Ambulatory Visit: Payer: PPO | Admitting: Dermatology

## 2022-01-09 DIAGNOSIS — R202 Paresthesia of skin: Secondary | ICD-10-CM

## 2022-01-09 DIAGNOSIS — Z1283 Encounter for screening for malignant neoplasm of skin: Secondary | ICD-10-CM | POA: Diagnosis not present

## 2022-01-09 DIAGNOSIS — L820 Inflamed seborrheic keratosis: Secondary | ICD-10-CM | POA: Diagnosis not present

## 2022-01-09 DIAGNOSIS — Z85828 Personal history of other malignant neoplasm of skin: Secondary | ICD-10-CM | POA: Diagnosis not present

## 2022-01-09 DIAGNOSIS — Z8589 Personal history of malignant neoplasm of other organs and systems: Secondary | ICD-10-CM

## 2022-01-09 NOTE — Progress Notes (Signed)
? ?  Follow-Up Visit ?  ?Subjective  ?Jenny Ford is a 71 y.o. female who presents for the following: Annual Exam (No real concerns other than -Back- itches all the time). ? ?Annual skin check, check site of skin cancer nose plus itchy area on back and new spots on chest ?Location:  ?Duration:  ?Quality:  ?Associated Signs/Symptoms: ?Modifying Factors:  ?Severity:  ?Timing: ?Context:  ? ?Objective  ?Well appearing patient in no apparent distress; mood and affect are within normal limits. ?Normal skin examination: No atypical nevi or signs of NMSC noted at the time of the visit.  ? ?Mid Back ?No atypical nevi or signs of NMSC noted at the time of the visit.  Recurring itch central back.  There are 2 slightly inflamed keratoses in the area but I believe these are not the cause of her itching. ? ?Chest - Medial (Center) (5), Mid Back (2) ?Back and upper chest, under the left breast and angiomas on the torso to keratoses on mid back and to no response on upper chest clinically/historically inflamed.  Others or not. ? ?Dorsum of Nose ?Patient currently treating with Arazlo 1-2 times a week.  Area is now smooth with no sign of residual skin cancer. ? ? ? ?A full examination was performed including scalp, head, eyes, ears, nose, lips, neck, chest, axillae, abdomen, back, buttocks, bilateral upper extremities, bilateral lower extremities, hands, feet, fingers, toes, fingernails, and toenails. All findings within normal limits unless otherwise noted below.  Is beneath undergarments not fully examined ? ? ?Assessment & Plan  ? ? ?Screening exam for skin cancer ? ?Keep yearly skin exams, encouraged to self examine twice annually.  Continue ultraviolet protection. ? ?Notalgia paresthetica ?Mid Back ? ?If treating the 2 keratoses with freezing and does not alleviate the itch, she will look for an over-the-counter antipruritic containing the ingredient pramoxine.  This can be used on a as needed basis. ? ?Seborrheic  keratosis, inflamed (7) ?Chest - Medial (Center) (5); Mid Back (2) ? ?Destruction of lesion - Mid Back ?Complexity: simple   ?Destruction method: cryotherapy   ?Informed consent: discussed and consent obtained   ?Timeout:  patient name, date of birth, surgical site, and procedure verified ?Lesion destroyed using liquid nitrogen: Yes   ?Cryotherapy cycles:  3 ?Outcome: patient tolerated procedure well with no complications   ?Post-procedure details: wound care instructions given   ? ?History of squamous cell carcinoma ?Dorsum of Nose ? ?Scar is clear today, check as needed change ? ? ? ? ? ?I, Lavonna Monarch, MD, have reviewed all documentation for this visit.  The documentation on 01/09/22 for the exam, diagnosis, procedures, and orders are all accurate and complete. ?

## 2022-03-20 ENCOUNTER — Other Ambulatory Visit: Payer: Self-pay | Admitting: Internal Medicine

## 2022-03-20 DIAGNOSIS — I10 Essential (primary) hypertension: Secondary | ICD-10-CM | POA: Diagnosis not present

## 2022-03-20 DIAGNOSIS — Z1231 Encounter for screening mammogram for malignant neoplasm of breast: Secondary | ICD-10-CM

## 2022-03-27 DIAGNOSIS — E559 Vitamin D deficiency, unspecified: Secondary | ICD-10-CM | POA: Diagnosis not present

## 2022-03-27 DIAGNOSIS — M8580 Other specified disorders of bone density and structure, unspecified site: Secondary | ICD-10-CM | POA: Diagnosis not present

## 2022-03-27 DIAGNOSIS — I10 Essential (primary) hypertension: Secondary | ICD-10-CM | POA: Diagnosis not present

## 2022-03-27 DIAGNOSIS — Z0000 Encounter for general adult medical examination without abnormal findings: Secondary | ICD-10-CM | POA: Diagnosis not present

## 2022-03-27 DIAGNOSIS — E039 Hypothyroidism, unspecified: Secondary | ICD-10-CM | POA: Diagnosis not present

## 2022-03-27 DIAGNOSIS — E782 Mixed hyperlipidemia: Secondary | ICD-10-CM | POA: Diagnosis not present

## 2022-04-03 DIAGNOSIS — Z1212 Encounter for screening for malignant neoplasm of rectum: Secondary | ICD-10-CM | POA: Diagnosis not present

## 2022-04-03 DIAGNOSIS — Z1211 Encounter for screening for malignant neoplasm of colon: Secondary | ICD-10-CM | POA: Diagnosis not present

## 2022-04-05 DIAGNOSIS — E782 Mixed hyperlipidemia: Secondary | ICD-10-CM | POA: Diagnosis not present

## 2022-04-05 DIAGNOSIS — M810 Age-related osteoporosis without current pathological fracture: Secondary | ICD-10-CM | POA: Diagnosis not present

## 2022-04-06 ENCOUNTER — Ambulatory Visit
Admission: RE | Admit: 2022-04-06 | Discharge: 2022-04-06 | Disposition: A | Discharge status: Home / Self Care | Payer: PPO | Source: Ambulatory Visit | Attending: Internal Medicine | Admitting: Internal Medicine

## 2022-04-06 DIAGNOSIS — Z1231 Encounter for screening mammogram for malignant neoplasm of breast: Secondary | ICD-10-CM | POA: Diagnosis not present

## 2022-04-11 DIAGNOSIS — E039 Hypothyroidism, unspecified: Secondary | ICD-10-CM | POA: Diagnosis not present

## 2022-04-11 LAB — COLOGUARD: COLOGUARD: NEGATIVE

## 2022-04-11 LAB — EXTERNAL GENERIC LAB PROCEDURE: COLOGUARD: NEGATIVE

## 2022-04-24 DIAGNOSIS — E039 Hypothyroidism, unspecified: Secondary | ICD-10-CM | POA: Diagnosis not present

## 2022-06-07 DIAGNOSIS — E039 Hypothyroidism, unspecified: Secondary | ICD-10-CM | POA: Diagnosis not present

## 2022-07-11 DIAGNOSIS — M810 Age-related osteoporosis without current pathological fracture: Secondary | ICD-10-CM | POA: Diagnosis not present

## 2022-07-30 ENCOUNTER — Other Ambulatory Visit: Payer: Self-pay

## 2022-07-30 ENCOUNTER — Emergency Department (HOSPITAL_COMMUNITY)
Admission: EM | Admit: 2022-07-30 | Discharge: 2022-07-31 | Disposition: A | Discharge status: Home / Self Care | Payer: PPO | Attending: Emergency Medicine | Admitting: Emergency Medicine

## 2022-07-30 ENCOUNTER — Emergency Department (HOSPITAL_COMMUNITY): Payer: PPO

## 2022-07-30 ENCOUNTER — Encounter (HOSPITAL_COMMUNITY): Payer: Self-pay | Admitting: Emergency Medicine

## 2022-07-30 DIAGNOSIS — R7989 Other specified abnormal findings of blood chemistry: Secondary | ICD-10-CM | POA: Insufficient documentation

## 2022-07-30 DIAGNOSIS — R778 Other specified abnormalities of plasma proteins: Secondary | ICD-10-CM | POA: Diagnosis not present

## 2022-07-30 DIAGNOSIS — R519 Headache, unspecified: Secondary | ICD-10-CM | POA: Insufficient documentation

## 2022-07-30 DIAGNOSIS — I10 Essential (primary) hypertension: Secondary | ICD-10-CM | POA: Diagnosis not present

## 2022-07-30 DIAGNOSIS — R7309 Other abnormal glucose: Secondary | ICD-10-CM | POA: Insufficient documentation

## 2022-07-30 DIAGNOSIS — R0789 Other chest pain: Secondary | ICD-10-CM | POA: Diagnosis not present

## 2022-07-30 LAB — CBC WITH DIFFERENTIAL/PLATELET
Abs Immature Granulocytes: 0.01 10*3/uL (ref 0.00–0.07)
Basophils Absolute: 0 10*3/uL (ref 0.0–0.1)
Basophils Relative: 1 %
Eosinophils Absolute: 0.3 10*3/uL (ref 0.0–0.5)
Eosinophils Relative: 4 %
HCT: 43.9 % (ref 36.0–46.0)
Hemoglobin: 14.6 g/dL (ref 12.0–15.0)
Immature Granulocytes: 0 %
Lymphocytes Relative: 30 %
Lymphs Abs: 2.1 10*3/uL (ref 0.7–4.0)
MCH: 31.4 pg (ref 26.0–34.0)
MCHC: 33.3 g/dL (ref 30.0–36.0)
MCV: 94.4 fL (ref 80.0–100.0)
Monocytes Absolute: 0.7 10*3/uL (ref 0.1–1.0)
Monocytes Relative: 10 %
Neutro Abs: 3.9 10*3/uL (ref 1.7–7.7)
Neutrophils Relative %: 55 %
Platelets: 257 10*3/uL (ref 150–400)
RBC: 4.65 MIL/uL (ref 3.87–5.11)
RDW: 12.8 % (ref 11.5–15.5)
WBC: 7.1 10*3/uL (ref 4.0–10.5)
nRBC: 0 % (ref 0.0–0.2)

## 2022-07-30 LAB — URINALYSIS, ROUTINE W REFLEX MICROSCOPIC
Bilirubin Urine: NEGATIVE
Glucose, UA: NEGATIVE mg/dL
Hgb urine dipstick: NEGATIVE
Ketones, ur: NEGATIVE mg/dL
Leukocytes,Ua: NEGATIVE
Nitrite: NEGATIVE
Protein, ur: NEGATIVE mg/dL
Specific Gravity, Urine: 1.004 — ABNORMAL LOW (ref 1.005–1.030)
pH: 8 (ref 5.0–8.0)

## 2022-07-30 NOTE — ED Triage Notes (Signed)
Pt reported to E with c/o elevated BP reading of 180/110 at home this evening. Pt also states she has taken OTC tylenol medication today because of recurrent headache over right eye that has been ongoing this week. Pt resents as anxious and has rapid speech.Stroke exam negative.

## 2022-07-30 NOTE — ED Provider Triage Note (Signed)
Emergency Medicine Provider Triage Evaluation Note  KIIRA BRACH , a 71 y.o. female  was evaluated in triage.  Pt complains of concerns for elevated blood pressure at home.  Notes that her blood pressure was 154 systolically.  Notes that she had a right-sided headache ongoing for the past 5 days that has been alleviated with over-the-counter Tylenol.  Has associated indigestion, chest discomfort.  Denies shortness of breath, nausea, vomiting, urinary symptoms, neck pain, back pain.  Notes that she takes amlodipine and ramipril without missed doses.  Review of Systems  Positive:  Negative:   Physical Exam  BP (!) 154/83 (BP Location: Left Arm)   Pulse 81   Temp 97.9 F (36.6 C) (Oral)   Resp 18   SpO2 95%  Gen:   Awake, no distress   Resp:  Normal effort  MSK:   Moves extremities without difficulty  Other:  No focal neurological deficits.  Negative pronator drift.  Strength sensation intact bilateral upper and lower extremities.  Grip strength 5/5 bilaterally.  Patient able to ambulate without assistance or difficulty.  Medical Decision Making  Medically screening exam initiated at 11:16 PM.  Appropriate orders placed.  Lanice Schwab was informed that the remainder of the evaluation will be completed by another provider, this initial triage assessment does not replace that evaluation, and the importance of remaining in the ED until their evaluation is complete.     Aala Ransom A, PA-C 07/30/22 2318

## 2022-07-31 DIAGNOSIS — I10 Essential (primary) hypertension: Secondary | ICD-10-CM | POA: Diagnosis not present

## 2022-07-31 LAB — COMPREHENSIVE METABOLIC PANEL
ALT: 23 U/L (ref 0–44)
AST: 34 U/L (ref 15–41)
Albumin: 4.5 g/dL (ref 3.5–5.0)
Alkaline Phosphatase: 93 U/L (ref 38–126)
Anion gap: 13 (ref 5–15)
BUN: 15 mg/dL (ref 8–23)
CO2: 28 mmol/L (ref 22–32)
Calcium: 10.6 mg/dL — ABNORMAL HIGH (ref 8.9–10.3)
Chloride: 102 mmol/L (ref 98–111)
Creatinine, Ser: 0.79 mg/dL (ref 0.44–1.00)
GFR, Estimated: >60 mL/min (ref >60)
Glucose, Bld: 101 mg/dL — ABNORMAL HIGH (ref 70–99)
Potassium: 4.5 mmol/L (ref 3.5–5.1)
Sodium: 143 mmol/L (ref 135–145)
Total Bilirubin: 0.3 mg/dL (ref 0.3–1.2)
Total Protein: 7.9 g/dL (ref 6.5–8.1)

## 2022-07-31 LAB — TROPONIN I (HIGH SENSITIVITY)
Troponin I (High Sensitivity): 7 ng/L (ref <18)
Troponin I (High Sensitivity): 20 ng/L — ABNORMAL HIGH (ref <18)
Troponin I (High Sensitivity): 25 ng/L — ABNORMAL HIGH (ref <18)

## 2022-07-31 LAB — SEDIMENTATION RATE: Sed Rate: 24 mm/hr — ABNORMAL HIGH (ref 0–22)

## 2022-07-31 MED ORDER — ACETAMINOPHEN 325 MG PO TABS
650.0000 mg | ORAL_TABLET | Freq: Once | ORAL | Status: AC
  Administered 2022-07-31: 650 mg via ORAL
  Filled 2022-07-31: qty 2

## 2022-07-31 MED ORDER — ASPIRIN 81 MG PO CHEW
324.0000 mg | CHEWABLE_TABLET | Freq: Once | ORAL | Status: AC
  Administered 2022-07-31: 324 mg via ORAL
  Filled 2022-07-31: qty 4

## 2022-07-31 NOTE — Discharge Instructions (Signed)
Stop take pain medication which contains phenylephrine.  You may safely take plain acetaminophen and ibuprofen for pain.  Your troponin, and enzyme from your heart, was mildly elevated.  This is probably related to your blood pressure elevation today and the stress on your heart from phenylephrine, which is a stimulant.  Please make a follow-up appointment with your cardiologist.  Please only measure your blood pressure once or twice a day.

## 2022-07-31 NOTE — Consult Note (Addendum)
Cardiology Consult    Patient ID: Jenny Ford MRN: [id number], DOB/AGE: 70/19/1952   Admit date: 07/30/2022 Date of Consult: 07/31/2022 Requesting Provider: Delora Fuel, MD  PCP:  Deland Pretty, MD   Cardiologist:  None (seen by Dr. Percival Spanish in 2018 for LBBB)  Patient Profile    Jenny Ford is a 71 y.o. female with a history of hypertension, hyperlipidemia, thyroid disease, and LBBB. She presented with elevated blood pressure associated with headache is being seen today (07/31/2022) for the evaluation of a slightly elevated troponin.   History of Present Illness    Checks BP multiple times per day on a regular basis. Typically is 120s/80s - she is on ramipril and norvasc in the evening. She does a high-intensity aerobic work-out 3 times a week with no symptoms and good exercise tolerance and typically follows a Heart Healthy diet. This past week she has been "cheating" and eating cookies she made as well as cheese-steak. She noted her blood pressure was running more in the 342A systolic and also had what she felt to be sinus pressure. She took Tylenol Cold and Sinus with phenylephrine for the left frontal headache a few times last week and again yesterday. Yesterday she noted her blood pressure spiked to 180s/110s after taking the medication so she came to the ED.   She denies any chest discomfort, dyspnea, nausea, diaphoresis, orthopnea, or other exertional intolerance now or recently.  BP has been persistently elevated in the ED, as high as 182/86, other VS wnl. Serial troponins were inadvertently sent with initial labs - first resulted in normal range and the second was borderline elevated at 20. ECG ordered that showed SR with PACs and baseline LBBB. Other labwork and CT head are unrevealing.   Had a normal/low risk stress test in 2018 after being seen for evaluation of a LBBB noted on ECG. LDL has been high as high as 182 the past but she is not on statin as she was told by  her endocrinologist that thyroid issues can cause lipid abnormalities and she wanted to correct this first. Repeat LDL was 154 in 2016.   Past Medical History   Past Medical History:  Diagnosis Date   Hyperlipidemia    LDL 172(1674/444),TG 106  . LDL goal = < 140   Hypertension    Squamous cell carcinoma of skin 01/14/2018   scc bridge of nose tx cx3 22f   Thyroid disease    thyroid nodules; Dr.Balan    Past Surgical History:  Procedure Laterality Date   COLONOSCOPY  2006   Negative; Dr BOlevia Perches  G2 P2     MOUTH SURGERY     maxillary osteoma resected; Dr WMarcelyn Ditty  septopasty     tendon tear      L ankle      Allergies  Allergen Reactions   Technetium-996m  Alcon Tears [Hypromellose] Other (See Comments)   Fosamax [Alendronate Sodium]     Taken April - Sept 2014 ( 5 mos). She was concerned about aching in her legs. An oral surgeon expressed concerns about osteonecrosis of the jaw from this medication.   Other Other (See Comments)   Pneumococcal Vaccine Other (See Comments)   Inpatient Medications      Family History    Family History  Problem Relation Age of Onset   Coronary artery disease Mother [id number]     angina   Alzheimer's disease Mother    Breast cancer  Mother 55   Asthma Father    Thyroid nodules Sister        TWIN   Heart attack Maternal Uncle        2 uncles; 1 in his 53s & 1 in 87s   Heart attack Maternal Grandmother        after 70   Breast cancer Cousin        88s   Coronary artery disease Brother 38       STENTS > 55;ANXIETY   Diabetes Neg Hx    Stroke Neg Hx    She indicated that her mother is deceased. She indicated that her father is deceased. She indicated that the status of her sister is unknown. She indicated that the status of her brother is unknown. She indicated that the status of her maternal grandmother is unknown. She indicated that the status of her maternal uncle is unknown. She indicated that the status of her cousin is unknown.  She indicated that the status of her neg hx is unknown. She indicated that the status of her other is unknown and reported the following: COAD.   Social History    Social History   Socioeconomic History   Marital status: Married    Spouse name: Not on file   Number of children: 2   Years of education: Not on file   Highest education level: Not on file  Occupational History   Occupation: SENIOR EXCUTIVE ADMIN [employer]    Employer: [employer]  Tobacco Use   Smoking status: Never   Smokeless tobacco: Never  Vaping Use   Vaping Use: Never used  Substance and Sexual Activity   Alcohol use: No   Drug use: No   Sexual activity: Not on file  Other Topics Concern   Not on file  Social History Narrative   Not on file   Social Determinants of Health   Financial Resource Strain: Not on file  Food Insecurity: Not on file  Transportation Needs: Not on file  Physical Activity: Not on file  Stress: Not on file  Social Connections: Not on file  Intimate Partner Violence: Not on file     Review of Systems    General:  No chills, fever, night sweats or weight changes.  Cardiovascular:  No chest pain, dyspnea on exertion, edema, orthopnea, palpitations, paroxysmal nocturnal dyspnea. Dermatological: No rash, lesions/masses Respiratory: No cough, dyspnea Urologic: No hematuria, dysuria Abdominal:   No nausea, vomiting, diarrhea, bright red blood per rectum, melena, or hematemesis Neurologic:  No visual changes, wkns, changes in mental status. All other systems reviewed and are otherwise negative except as noted above.  Physical Exam    Blood pressure (!) 165/86, pulse 73, temperature 98 F (36.7 C), temperature source Oral, resp. rate 14, SpO2 98 %.    No intake or output data in the 24 hours ending 07/31/22 0350 Wt Readings from Last 3 Encounters:  11/15/16 64.4 kg  10/27/16 64.4 kg  09/08/15 65 kg    CONSTITUTIONAL: alert and conversant, well-appearing, nourished, no  distress HEENT: normal NECK: no JVD, no masses CARDIAC: Regular rhythm. Normal S1/S2, no S3/S4. No murmur. No friction rub. VASCULAR: Radial pulses intact bilaterally. No carotid bruits. PULMONARY/CHEST WALL: no deformities, normal breath sounds bilaterally, normal work of breathing ABDOMINAL: soft, non-tender, non-distended EXTREMITIES: no edema, no muscle atrophy, warm and well-perfused SKIN: Dry and intact without apparent rashes or wounds. No peripheral cyanosis. NEUROLOGIC: alert, no abnormal movements, cranial nerves grossly intact. PSYCH:  normal affect, normal speech and language   Labs    Recent Labs    07/30/22 2326 07/31/22 0110  TROPONINIHS 7 20*   Lab Results  Component Value Date   WBC 7.1 07/30/2022   HGB 14.6 07/30/2022   HCT 43.9 07/30/2022   MCV 94.4 07/30/2022   PLT 257 07/30/2022    Recent Labs  Lab 07/30/22 2326  NA 143  K 4.5  CL 102  CO2 28  BUN 15  CREATININE 0.79  CALCIUM 10.6*  PROT 7.9  BILITOT 0.3  ALKPHOS 93  ALT 23  AST 34  GLUCOSE 101*   Lab Results  Component Value Date   CHOL 233 (H) 09/09/2015   HDL 49.30 09/09/2015   LDLCALC 154 (H) 09/09/2015   TRIG 150.0 (H) 09/09/2015     Radiology Studies    CT Head Wo Contrast  Result Date: 07/30/2022 CLINICAL DATA:  Headache, new onset (Age >= 21y) EXAM: CT HEAD WITHOUT CONTRAST TECHNIQUE: Contiguous axial images were obtained from the base of the skull through the vertex without intravenous contrast. RADIATION DOSE REDUCTION: This exam was performed according to the departmental dose-optimization program which includes automated exposure control, adjustment of the mA and/or kV according to patient size and/or use of iterative reconstruction technique. COMPARISON:  None Available. FINDINGS: Brain: No intracranial hemorrhage, mass effect, or midline shift. Brain volume is normal for age. No hydrocephalus. The basilar cisterns are patent. No evidence of territorial infarct or acute  ischemia. No extra-axial or intracranial fluid collection. Vascular: No hyperdense vessel or unexpected calcification. Skull: No fracture or focal lesion. Sinuses/Orbits: Paranasal sinuses and mastoid air cells are clear. The visualized orbits are unremarkable. Other: None. IMPRESSION: Negative noncontrast head CT. Electronically Signed   By: Keith Rake M.D.   On: 07/30/2022 23:56   DG Chest 2 View  Result Date: 07/30/2022 CLINICAL DATA:  Chest pressure EXAM: CHEST - 2 VIEW COMPARISON:  None Available. FINDINGS: The heart size and mediastinal contours are within normal limits. Both lungs are clear. The visualized skeletal structures are unremarkable. IMPRESSION: No active cardiopulmonary disease. Electronically Signed   By: Ronney Asters M.D.   On: 07/30/2022 23:49    ECG & Cardiac Imaging    ECG: SR with PACs and LBBB - personally reviewed.  Assessment & Plan    Severe hypertension in the setting of phenylephrine use and dietary indiscretion Marginal troponin elevation, likely due to severe hypertension, no cardiac symptoms  Hyperlipidemia, last LDL 154, not on statin Chronic LBBB with normal prior stress test, ECG today is unchanged  She has no symptoms other than headache which has been evaluated in the ED. Blood pressure is elevated in the setting of high salt intake and recent phenylephrine use, and still has no chest pain, dyspnea, or other cardiac symptomatology. She exercises regularly has excellent exertional tolerance. ECG is at baseline. No evidence of heart failure or other CV pathology on exam. Thus, she has no indication for further cardiac work-up. Incidental troponin of 20 likely attributable to severe hypertensive episode with phenylephrine. Her LDL has been elevated in the past and accounting for age and hypertension, she could be offered moderate intensity statin for primary prevention. She would like to get updated lipid profile and will consider.   Signed, Marykay Lex, MD 07/31/2022, 3:50 AM  For questions or updates, please contact   Please consult [email or web address] for contact info under Cardiology/STEMI.

## 2022-07-31 NOTE — ED Provider Notes (Signed)
[facility] EMERGENCY DEPARTMENT Provider Note   CSN: [id number] Arrival date & time: 07/30/22  2234     History  Chief Complaint  Patient presents with   Hypertension    Jenny Ford is a 71 y.o. female.  The history is provided by the patient.  Hypertension  She has history of hypertension, hyperlipidemia and comes in because of elevated blood pressure at home as well as a right frontal headache.  She usually checks her blood pressure 3-4 times a day and today, it was as high as 180/110.  This is higher than she can ever remember her blood pressure being before.  She checked it multiple times through the day.  She has also noted for approximately the last 2 weeks, she has been having an intermittent right frontal headache.  There are no associated visual disturbances and no photophobia or phonophobia.  She denies any fever.  She thought it was a sinus headache and took some Tylenol Sinus which did give her temporary relief, but headache would come back.  She denies fever or chills.  She denies weakness or numbness.  She states she did have some indigestion following eating a steak and she is concerned that it may have had a very high salt content.  She denies any chest discomfort or dyspnea.   Home Medications Prior to Admission medications   Medication Sig Start Date End Date Taking? Authorizing Provider  amLODipine (NORVASC) 5 MG tablet TAKE 1 1/2 TABLETS BY MOUTH DAILY GOAL = AVERAGE < 135/85 10/26/15   Binnie Rail, MD  aspirin EC 81 MG tablet Take 81 mg by mouth daily.    [provider]  BIOTIN PO Take 1 tablet by mouth daily.    [provider]  Calcium Carbonate-Vitamin D (CALTRATE 600+D PO) Take 2 tablets by mouth daily.    [provider]  Cyanocobalamin (VITAMIN B-12 PO) Take 1 tablet by mouth daily.    [provider]  Ergocalciferol (VITAMIN D2) 2000 units TABS Take 1 tablet by mouth daily.    [provider]  Fexofenadine HCl (ALLEGRA PO) Take 1 tablet by mouth daily.    [provider]  levothyroxine (SYNTHROID, LEVOTHROID) 88 MCG tablet Take 88 mcg by mouth daily.    [provider]  Multiple Vitamin (MULTIVITAMIN) tablet Take 1 tablet by mouth daily. Vitamins & Calcium    [provider]  Omega-3 Fatty Acids (FISH OIL PO) Take 1 capsule by mouth daily.    [provider]  ramipril (ALTACE) 10 MG capsule Take 1 capsule (10 mg total) by mouth daily. 10/26/15   Binnie Rail, MD  Tazarotene (ARAZLO) 0.045 % LOTN Apply 1 application topically at bedtime. 03/29/21   Lavonna Monarch, MD  Tazarotene (ARAZLO) 0.045 % LOTN Apply 1 application topically daily. 04/04/21   Lavonna Monarch, MD  Tazarotene (ARAZLO) 0.045 % LOTN Apply 1 application topically daily. 04/04/21   Lavonna Monarch, MD      Allergies    Technetium-51m Alcon tears [hypromellose], Fosamax [alendronate sodium], Other, and Pneumococcal vaccine    Review of Systems   Review of Systems  All other systems reviewed and are negative.   Physical Exam Updated Vital Signs BP (!) 154/83 (BP Location: Left Arm)   Pulse 81   Temp 97.9 F (36.6 C) (Oral)   Resp 18   SpO2 95%  Physical Exam Vitals and nursing note reviewed.   71year old female, resting comfortably and  in no acute distress. Vital signs are significant for elevated blood pressure. Oxygen saturation is 96%, which is normal. Head is normocephalic and atraumatic. PERRLA, EOMI. There is no sinus tenderness.  There is no tenderness to palpation over the temporal arteries.  There is no tenderness to palpation over the frontalis or temporalis muscles or over the insertion of the paracervical muscles. Neck is nontender and supple without adenopathy or JVD. Back is nontender and there is no CVA tenderness. Lungs are clear without rales, wheezes, or rhonchi. Chest is nontender. Heart has regular rate and rhythm without murmur. Abdomen  is soft, flat, nontender. Extremities have no cyanosis or edema, full range of motion is present. Skin is warm and dry without rash. Neurologic: Mental status is normal, cranial nerves are intact, strength is 5/5 in all 4 extremities.  There is no pronator drift.  There is no extinction on double simultaneous stimulation.  ED Results / Procedures / Treatments   Labs (all labs ordered are listed, but only abnormal results are displayed) Labs Reviewed  URINALYSIS, ROUTINE W REFLEX MICROSCOPIC - Abnormal; Notable for the following components:      Result Value   Color, Urine COLORLESS (*)    Specific Gravity, Urine 1.004 (*)    All other components within normal limits  COMPREHENSIVE METABOLIC PANEL - Abnormal; Notable for the following components:   Glucose, Bld 101 (*)    Calcium 10.6 (*)    All other components within normal limits  SEDIMENTATION RATE - Abnormal; Notable for the following components:   Sed Rate 24 (*)    All other components within normal limits  TROPONIN I (HIGH SENSITIVITY) - Abnormal; Notable for the following components:   Troponin I (High Sensitivity) 20 (*)    All other components within normal limits  TROPONIN I (HIGH SENSITIVITY) - Abnormal; Notable for the following components:   Troponin I (High Sensitivity) 25 (*)    All other components within normal limits  CBC WITH DIFFERENTIAL/PLATELET  TROPONIN I (HIGH SENSITIVITY)    EKG EKG Interpretation  Date/Time:  Monday July 31 2022 03:01:22 EST Ventricular Rate:  74 PR Interval:  162 QRS Duration: 137 QT Interval:  409 QTC Calculation: 454 R Axis:   26 Text Interpretation: Sinus rhythm Atrial premature complex Left bundle branch block When compared with ECG of 10/27/2016, No significant change was found Confirmed by Delora Fuel ([id number]) on 07/31/2022 3:09:56 AM  Radiology CT Head Wo Contrast  Result Date: 07/30/2022 CLINICAL DATA:  Headache, new onset (Age >= 51y) EXAM: CT HEAD WITHOUT  CONTRAST TECHNIQUE: Contiguous axial images were obtained from the base of the skull through the vertex without intravenous contrast. RADIATION DOSE REDUCTION: This exam was performed according to the departmental dose-optimization program which includes automated exposure control, adjustment of the mA and/or kV according to patient size and/or use of iterative reconstruction technique. COMPARISON:  None Available. FINDINGS: Brain: No intracranial hemorrhage, mass effect, or midline shift. Brain volume is normal for age. No hydrocephalus. The basilar cisterns are patent. No evidence of territorial infarct or acute ischemia. No extra-axial or intracranial fluid collection. Vascular: No hyperdense vessel or unexpected calcification. Skull: No fracture or focal lesion. Sinuses/Orbits: Paranasal sinuses and mastoid air cells are clear. The visualized orbits are unremarkable. Other: None. IMPRESSION: Negative noncontrast head CT. Electronically Signed   By: Keith Rake M.D.   On: 07/30/2022 23:56   DG Chest 2 View  Result Date: 07/30/2022 CLINICAL DATA:  Chest pressure EXAM: CHEST - 2  VIEW COMPARISON:  None Available. FINDINGS: The heart size and mediastinal contours are within normal limits. Both lungs are clear. The visualized skeletal structures are unremarkable. IMPRESSION: No active cardiopulmonary disease. Electronically Signed   By: Ronney Asters M.D.   On: 07/30/2022 23:49    Procedures Procedures  Cardiac monitor shows normal sinus rhythm, per my interpretation.  Medications Ordered in ED Medications  acetaminophen (TYLENOL) tablet 650 mg (650 mg Oral Given 07/31/22 0155)  aspirin chewable tablet 324 mg (324 mg Oral Given 07/31/22 0335)    ED Course/ Medical Decision Making/ A&P                           Medical Decision Making Amount and/or Complexity of Data Reviewed Labs: ordered.  Risk OTC drugs.   Elevated blood pressure in patient with history of hypertension.  She is quite  obsessed over the blood pressure readings today, and that can certainly be playing a role in maintaining an elevated blood pressure.  Her headache appears benign, but CT of head was ordered at triage and shows no acute process.  Have independently viewed the images, and agree with the radiologist's interpretation.  ECG was ordered at triage but had not been done.  However, on review of past records, prior ECGs have shown a complete left bundle branch block so there would be little value to obtaining an ECG today.  I personally reviewed the ECG from 10/27/2016 confirming that it did show a complete left bundle branch block.  Chest x-ray was also ordered at triage and is normal.  Have independently viewed the images, and agree with radiologist interpretation.  Laboratory work-up ordered at triage included troponin, CBC, comprehensive metabolic panel.  I have reviewed and interpreted these results, and my interpretation is borderline elevated random glucose of questionable clinical significance, mildly elevated calcium, and otherwise normal.  Blood pressure is elevated in the ED.  I have ordered a dose of acetaminophen for her headache.  Headache appears to be benign.  No evidence of cerebral hemorrhage or stroke on CT or exam.  No evidence of sinusitis based on exam and also CT.  I have ordered an erythrocyte sedimentation rate to try to rule out temporal arteritis, although this is very unlikely with no tenderness to palpation over the temporal artery.  I have reviewed the ingredients of her Tylenol sinus, and it does contain phenylephrine which can elevate her blood pressure and I have advised her not to take that anymore and just take plain acetaminophen.  Repeat troponin was drawn and my interpretation is abnormally high troponin with a rising value concerning for demand ischemia.  I have low index of suspicion for ACS.  Because of the rising troponin, I have ordered an electrocardiogram.  My interpretation of  the electrocardiogram is left bundle branch block with morphology unchanged from prior.  However, because of left bundle branch block presence, it is difficult to evaluate for ST changes and T wave changes.  I have ordered a dose of oral aspirin.  However, I do not feel with the history and minimal troponin elevation that anticoagulation is appropriate at this time.  I have discussed case with Dr. Alcario Drought of [facility] who indicates that he does not feel the patient needs to be admitted with lack of cardiac symptoms other than indigestion and minimal troponin elevation.  I am not comfortable with this, and I have ordered a cardiology consultation.  Heart score is 4  which puts her at moderate risk for major adverse cardiac events in the next 6 weeks.  I have discussed the case with Dr. Kalman Shan of cardiology service who agrees to see the patient in consultation.  Sedimentation rate has come back minimally elevated, essentially ruling out temporal arteritis.  Cardiology consultation is appreciated.  Cardiologist feels no further work-up is indicated.  However, out of an abundance of caution, I am ordering a third troponin to make sure that it is not continuing to rise.  Of note, blood pressure is now normal.  Third troponin has increased minimally.  In light of cardiology consult, I do feel she can safely be discharged.  She is advised to avoid all medications with phenylephrine or similar stimulants.  I did recommend follow-up with her cardiologist.  Final Clinical Impression(s) / ED Diagnoses Final diagnoses:  Elevated blood pressure reading with diagnosis of hypertension  Elevated troponin  Acute nonintractable headache, unspecified headache type  Elevated random blood glucose level    Rx / DC Orders ED Discharge Orders     None         Delora Fuel, MD 29/56/21 [phone]

## 2022-08-06 DIAGNOSIS — I160 Hypertensive urgency: Secondary | ICD-10-CM | POA: Insufficient documentation

## 2022-08-06 DIAGNOSIS — R7989 Other specified abnormal findings of blood chemistry: Secondary | ICD-10-CM | POA: Insufficient documentation

## 2022-08-06 NOTE — Progress Notes (Signed)
Cardiology Office Note   Date:  08/09/2022   ID:  Jenny Ford, DOB May 27, 1951, MRN [id number]  PCP:  Deland Pretty, MD  Cardiologist:   None Referring:  Deland Pretty, MD  Chief Complaint  Patient presents with   Hypertension      History of Present Illness: Jenny Ford is a 71 y.o. female who presents for evaluation of hypertension and LBBB.  She was in the ED a couple of weeks ago for this.  I reviewed these records for this visit.      Of note she had a sinus headache and had taken Tylenol phenylephrine about 5 pills over 2 days.  She was seen by our service in consultation.  She had a minimally elevated troponin.   Her BP was thought to be related to phenylephrine.  Trop was thought to be related to the hypertension.    She had a normal/low risk stress test in 2018 after being seen for evaluation of a LBBB noted on ECG.  She says that she does a lot of physical activity.  She denies any chest pressure, neck or arm discomfort.  She does aerobics.  She goes up and down stairs a lot.  With this she has no cardiovascular symptoms such as shortness of breath.  She does not have PND or orthopnea.  She has no palpitations, presyncope or syncope.   Past Medical History:  Diagnosis Date   Hyperlipidemia    LDL 172(1674/444),TG 106  . LDL goal = < 140   Hypertension    Squamous cell carcinoma of skin 01/14/2018   scc bridge of nose tx cx3 11f   Thyroid disease    thyroid nodules; Dr.Balan    Past Surgical History:  Procedure Laterality Date   COLONOSCOPY  2006   Negative; Dr BOlevia Perches  G2 P2     MOUTH SURGERY     maxillary osteoma resected; Dr WMarcelyn Ditty  septopasty     tendon tear      L ankle      Current Outpatient Medications  Medication Sig Dispense Refill   amLODipine (NORVASC) 5 MG tablet TAKE 1 1/2 TABLETS BY MOUTH DAILY GOAL = AVERAGE < 135/85 135 tablet 1   aspirin EC 81 MG tablet Take 81 mg by mouth daily.     BIOTIN PO Take 1 tablet by mouth daily.      Calcium Carbonate-Vitamin D (CALTRATE 600+D PO) Take 2 tablets by mouth daily.     Cyanocobalamin (VITAMIN B-12 PO) Take 1 tablet by mouth daily.     Ergocalciferol (VITAMIN D2) 2000 units TABS Take 1 tablet by mouth daily.     Fexofenadine HCl (ALLEGRA PO) Take 1 tablet by mouth daily.     levothyroxine (SYNTHROID, LEVOTHROID) 88 MCG tablet Take 88 mcg by mouth daily.     Multiple Vitamin (MULTIVITAMIN) tablet Take 1 tablet by mouth daily. Vitamins & Calcium     Omega-3 Fatty Acids (FISH OIL PO) Take 1 capsule by mouth daily.     ramipril (ALTACE) 10 MG capsule Take 1 capsule (10 mg total) by mouth daily. 90 capsule 1   Tazarotene (ARAZLO) 0.045 % LOTN Apply 1 application topically at bedtime. 45 g 2   Tazarotene (ARAZLO) 0.045 % LOTN Apply 1 application topically daily. 45 g 3   Tazarotene (ARAZLO) 0.045 % LOTN Apply 1 application topically daily. 45 g 3   No current facility-administered medications for this visit.    Allergies:  Technetium-29m Alcon tears [hypromellose], Fosamax [alendronate sodium], Other, and Pneumococcal vaccine    ROS:  Please see the history of present illness.   Otherwise, review of systems are positive for none.   All other systems are reviewed and negative.    PHYSICAL EXAM: VS:  BP 126/86   Pulse 86   Ht 5' 3.5" (1.613 m)   Wt 135 lb 3.2 oz (61.3 kg)   SpO2 97%   BMI 23.57 kg/m  , BMI Body mass index is 23.57 kg/m. GENERAL:  Well appearing HEENT:  Pupils equal round and reactive, fundi not visualized, oral mucosa unremarkable NECK:  No jugular venous distention, waveform within normal limits, carotid upstroke brisk and symmetric, no bruits, no thyromegaly LYMPHATICS:  No cervical, inguinal adenopathy LUNGS:  Clear to auscultation bilaterally BACK:  No CVA tenderness CHEST:  Unremarkable HEART:  PMI not displaced or sustained,S1 and S2 within normal limits, no S3, no S4, no clicks, no rubs, no murmurs ABD:  Flat, positive bowel sounds normal  in frequency in pitch, no bruits, no rebound, no guarding, no midline pulsatile mass, no hepatomegaly, no splenomegaly EXT:  2 plus pulses throughout, no edema, no cyanosis no clubbing SKIN:  No rashes no nodules NEURO:  Cranial nerves II through XII grossly intact, motor grossly intact throughout PSYCH:  Cognitively intact, oriented to person place and time    EKG:  EKG is not ordered today. The ekg ordered today demonstrates sinus rhythm, left bundle branch block, rate 74, 07/31/2022.  No change from previous.   Recent Labs: 07/30/2022: ALT 23; BUN 15; Creatinine, Ser 0.79; Hemoglobin 14.6; Platelets 257; Potassium 4.5; Sodium 143    Lipid Panel    Component Value Date/Time   CHOL 233 (H) 09/09/2015 0734   CHOL 257 (H) 09/07/2014 1020   TRIG 150.0 (H) 09/09/2015 0734   TRIG 122 09/07/2014 1020   HDL 49.30 09/09/2015 0734   HDL 51 09/07/2014 1020   CHOLHDL 5 09/09/2015 0734   VLDL 30.0 09/09/2015 0734   LDLCALC 154 (H) 09/09/2015 0734   LDLCALC 182 (H) 09/07/2014 1020   LDLDIRECT 180.1 09/05/2013 1047      Wt Readings from Last 3 Encounters:  08/09/22 135 lb 3.2 oz (61.3 kg)  11/15/16 142 lb (64.4 kg)  10/27/16 142 lb (64.4 kg)      Other studies Reviewed: Additional studies/ records that were reviewed today include: ED records. Review of the above records demonstrates:  Please see elsewhere in the note.     ASSESSMENT AND PLAN:  HTN: Her blood pressure is slightly elevated diastolic.  She is going to keep a blood pressure diary.  She only takes her amlodipine at 5 mg and was told previously to do 7-1/2.  She is going to give me the blood pressure diary and we talked about if she is not in the 120s over 70s I would increase to 7-1/2 every day.  ELEVATED TROPONIN: This was a minimal troponin elevation with a peak of 25 and was nondiagnostic.  She has no symptoms.  She has chronic left bundle branch block.  No change in therapy.  DYSLIPIDEMIA: I do not have the most  recent lipid but she does not want to take statin trying to manage this medically.  LBBB: This has been chronic.   Current medicines are reviewed at length with the patient today.  The patient does not have concerns regarding medicines.  The following changes have been made:  no change  Labs/ tests ordered  today include:  No orders of the defined types were placed in this encounter.    Disposition:   FU with me as needed.   Signed, Minus Breeding, MD  08/09/2022 9:06 AM    [place]

## 2022-08-09 ENCOUNTER — Encounter: Payer: Self-pay | Admitting: Cardiology

## 2022-08-09 ENCOUNTER — Ambulatory Visit: Payer: PPO | Attending: Cardiology | Admitting: Cardiology

## 2022-08-09 VITALS — BP 126/86 | HR 86 | Ht 63.5 in | Wt 135.2 lb

## 2022-08-09 DIAGNOSIS — I160 Hypertensive urgency: Secondary | ICD-10-CM | POA: Diagnosis not present

## 2022-08-09 DIAGNOSIS — E785 Hyperlipidemia, unspecified: Secondary | ICD-10-CM | POA: Diagnosis not present

## 2022-08-09 DIAGNOSIS — R7989 Other specified abnormal findings of blood chemistry: Secondary | ICD-10-CM

## 2022-08-09 NOTE — Patient Instructions (Signed)
    Follow-Up: At [facility], you and your health needs are our priority.  As part of our continuing mission to provide you with exceptional heart care, we have created designated Provider Care Teams.  These Care Teams include your primary Cardiologist (physician) and Advanced Practice Providers (APPs -  Physician Assistants and Nurse Practitioners) who all work together to provide you with the care you need, when you need it.  We recommend signing up for the patient portal called "MyChart".  Sign up information is provided on this After Visit Summary.  MyChart is used to connect with patients for Virtual Visits (Telemedicine).  Patients are able to view lab/test results, encounter notes, upcoming appointments, etc.  Non-urgent messages can be sent to your provider as well.   To learn more about what you can do with MyChart, go to [email or web address].    Your next appointment:   As needed        

## 2022-08-10 ENCOUNTER — Telehealth: Payer: Self-pay | Admitting: Cardiology

## 2022-08-10 NOTE — Telephone Encounter (Signed)
Patient asked if she should be taking calcium '1200mg'$  in the AM and '1200mg'$  in the PM for osteopenia. She has been on it for years. He calcium level on 11/19 was 10.6. She also asked if she should be taking ASA '81mg'$  daily. Please advise.

## 2022-08-10 NOTE — Telephone Encounter (Signed)
New Message:   Patient  was here yesterday to see Dr Percival Spanish. She says she have some questions about her medicine.     Pt c/o medication issue:  1. Name of Medication: Aspirin and Vitamin  2. How are you currently taking this medication (dosage and times per day)?   3. Are you having a reaction (difficulty breathing--STAT)?   4. What is your medication issue? Patient says she have some questions about these medicine

## 2022-08-14 NOTE — Telephone Encounter (Signed)
Spoke with patient and gave her Dr. Rosezella Florida reply: "I don't see an indication for ASA at this point.  I would need to defer to calcium question to her PCP or the prescribing provider." She verbalized understanding.

## 2022-08-14 NOTE — Telephone Encounter (Signed)
Patient is returning call.  °

## 2022-08-14 NOTE — Telephone Encounter (Signed)
LMTCB

## 2022-08-15 ENCOUNTER — Telehealth: Payer: Self-pay

## 2022-08-15 NOTE — Telephone Encounter (Signed)
     Patient  visit on 07/31/2022  at [facility] was for Essential (primary) hypertension.  Have you been able to follow up with your primary care physician? Patient declined to answer ED follow up questions. Disconnected call.  The patient was or was not able to obtain any needed medicine or equipment. Patient declined to answer ED follow up questions. Disconnected call.  Are there diet recommendations that you are having difficulty following? Patient declined to answer ED follow up questions. Disconnected call.  Patient expresses understanding of discharge instructions and education provided has no other needs at this time.    [place] Resource Care Guide   [email or web address]  ?? [phone]   Website: [email or web address]  [email or web address]

## 2022-08-17 DIAGNOSIS — E782 Mixed hyperlipidemia: Secondary | ICD-10-CM | POA: Diagnosis not present

## 2022-08-17 DIAGNOSIS — M810 Age-related osteoporosis without current pathological fracture: Secondary | ICD-10-CM | POA: Diagnosis not present

## 2022-08-17 DIAGNOSIS — E039 Hypothyroidism, unspecified: Secondary | ICD-10-CM | POA: Diagnosis not present

## 2022-08-24 DIAGNOSIS — E785 Hyperlipidemia, unspecified: Secondary | ICD-10-CM | POA: Diagnosis not present

## 2022-08-24 DIAGNOSIS — E039 Hypothyroidism, unspecified: Secondary | ICD-10-CM | POA: Diagnosis not present

## 2022-08-24 DIAGNOSIS — I10 Essential (primary) hypertension: Secondary | ICD-10-CM | POA: Diagnosis not present

## 2022-08-24 DIAGNOSIS — E559 Vitamin D deficiency, unspecified: Secondary | ICD-10-CM | POA: Diagnosis not present

## 2022-08-24 DIAGNOSIS — E049 Nontoxic goiter, unspecified: Secondary | ICD-10-CM | POA: Diagnosis not present

## 2022-09-07 DIAGNOSIS — Z01419 Encounter for gynecological examination (general) (routine) without abnormal findings: Secondary | ICD-10-CM | POA: Diagnosis not present

## 2022-09-07 DIAGNOSIS — Z6826 Body mass index (BMI) 26.0-26.9, adult: Secondary | ICD-10-CM | POA: Diagnosis not present

## 2022-09-28 DIAGNOSIS — H903 Sensorineural hearing loss, bilateral: Secondary | ICD-10-CM | POA: Diagnosis not present

## 2022-09-28 DIAGNOSIS — H938X3 Other specified disorders of ear, bilateral: Secondary | ICD-10-CM | POA: Diagnosis not present

## 2022-12-25 DIAGNOSIS — H40013 Open angle with borderline findings, low risk, bilateral: Secondary | ICD-10-CM | POA: Diagnosis not present

## 2022-12-25 DIAGNOSIS — H2513 Age-related nuclear cataract, bilateral: Secondary | ICD-10-CM | POA: Diagnosis not present

## 2022-12-25 DIAGNOSIS — H52203 Unspecified astigmatism, bilateral: Secondary | ICD-10-CM | POA: Diagnosis not present

## 2022-12-25 DIAGNOSIS — H01001 Unspecified blepharitis right upper eyelid: Secondary | ICD-10-CM | POA: Diagnosis not present

## 2022-12-25 DIAGNOSIS — H01004 Unspecified blepharitis left upper eyelid: Secondary | ICD-10-CM | POA: Diagnosis not present

## 2022-12-25 DIAGNOSIS — H524 Presbyopia: Secondary | ICD-10-CM | POA: Diagnosis not present

## 2023-01-10 ENCOUNTER — Ambulatory Visit: Payer: PPO | Admitting: Dermatology

## 2023-02-02 DIAGNOSIS — L821 Other seborrheic keratosis: Secondary | ICD-10-CM | POA: Diagnosis not present

## 2023-02-02 DIAGNOSIS — D224 Melanocytic nevi of scalp and neck: Secondary | ICD-10-CM | POA: Diagnosis not present

## 2023-02-02 DIAGNOSIS — L218 Other seborrheic dermatitis: Secondary | ICD-10-CM | POA: Diagnosis not present

## 2023-02-02 DIAGNOSIS — I788 Other diseases of capillaries: Secondary | ICD-10-CM | POA: Diagnosis not present

## 2023-02-02 DIAGNOSIS — L814 Other melanin hyperpigmentation: Secondary | ICD-10-CM | POA: Diagnosis not present

## 2023-02-02 DIAGNOSIS — L853 Xerosis cutis: Secondary | ICD-10-CM | POA: Diagnosis not present

## 2023-02-02 DIAGNOSIS — D2371 Other benign neoplasm of skin of right lower limb, including hip: Secondary | ICD-10-CM | POA: Diagnosis not present

## 2023-02-02 DIAGNOSIS — D225 Melanocytic nevi of trunk: Secondary | ICD-10-CM | POA: Diagnosis not present

## 2023-02-02 DIAGNOSIS — D1801 Hemangioma of skin and subcutaneous tissue: Secondary | ICD-10-CM | POA: Diagnosis not present

## 2023-02-02 DIAGNOSIS — L603 Nail dystrophy: Secondary | ICD-10-CM | POA: Diagnosis not present

## 2023-03-01 ENCOUNTER — Other Ambulatory Visit: Payer: Self-pay | Admitting: Internal Medicine

## 2023-03-01 DIAGNOSIS — Z1231 Encounter for screening mammogram for malignant neoplasm of breast: Secondary | ICD-10-CM

## 2023-03-28 DIAGNOSIS — R748 Abnormal levels of other serum enzymes: Secondary | ICD-10-CM | POA: Diagnosis not present

## 2023-03-28 DIAGNOSIS — I10 Essential (primary) hypertension: Secondary | ICD-10-CM | POA: Diagnosis not present

## 2023-03-29 LAB — LAB REPORT - SCANNED: eGFR: 76

## 2023-04-02 DIAGNOSIS — R748 Abnormal levels of other serum enzymes: Secondary | ICD-10-CM | POA: Diagnosis not present

## 2023-04-02 DIAGNOSIS — M810 Age-related osteoporosis without current pathological fracture: Secondary | ICD-10-CM | POA: Diagnosis not present

## 2023-04-02 DIAGNOSIS — E559 Vitamin D deficiency, unspecified: Secondary | ICD-10-CM | POA: Diagnosis not present

## 2023-04-02 DIAGNOSIS — I10 Essential (primary) hypertension: Secondary | ICD-10-CM | POA: Diagnosis not present

## 2023-04-02 DIAGNOSIS — E039 Hypothyroidism, unspecified: Secondary | ICD-10-CM | POA: Diagnosis not present

## 2023-04-02 DIAGNOSIS — E785 Hyperlipidemia, unspecified: Secondary | ICD-10-CM | POA: Diagnosis not present

## 2023-04-02 DIAGNOSIS — Z0000 Encounter for general adult medical examination without abnormal findings: Secondary | ICD-10-CM | POA: Diagnosis not present

## 2023-04-09 ENCOUNTER — Encounter: Payer: Self-pay | Admitting: *Deleted

## 2023-04-10 ENCOUNTER — Ambulatory Visit: Admission: RE | Admit: 2023-04-10 | Payer: PPO | Source: Ambulatory Visit

## 2023-04-10 DIAGNOSIS — Z1231 Encounter for screening mammogram for malignant neoplasm of breast: Secondary | ICD-10-CM

## 2023-04-18 DIAGNOSIS — I10 Essential (primary) hypertension: Secondary | ICD-10-CM | POA: Diagnosis not present

## 2023-04-18 DIAGNOSIS — E785 Hyperlipidemia, unspecified: Secondary | ICD-10-CM | POA: Diagnosis not present

## 2023-06-04 DIAGNOSIS — E785 Hyperlipidemia, unspecified: Secondary | ICD-10-CM | POA: Diagnosis not present

## 2023-07-04 DIAGNOSIS — M25519 Pain in unspecified shoulder: Secondary | ICD-10-CM | POA: Diagnosis not present

## 2023-07-12 DIAGNOSIS — M25519 Pain in unspecified shoulder: Secondary | ICD-10-CM | POA: Diagnosis not present

## 2023-07-18 DIAGNOSIS — M25519 Pain in unspecified shoulder: Secondary | ICD-10-CM | POA: Diagnosis not present

## 2023-07-20 DIAGNOSIS — S90852A Superficial foreign body, left foot, initial encounter: Secondary | ICD-10-CM | POA: Diagnosis not present

## 2023-07-25 DIAGNOSIS — M25519 Pain in unspecified shoulder: Secondary | ICD-10-CM | POA: Diagnosis not present

## 2023-08-01 DIAGNOSIS — M25519 Pain in unspecified shoulder: Secondary | ICD-10-CM | POA: Diagnosis not present

## 2023-08-05 DIAGNOSIS — M25519 Pain in unspecified shoulder: Secondary | ICD-10-CM | POA: Diagnosis not present

## 2023-08-15 DIAGNOSIS — M25519 Pain in unspecified shoulder: Secondary | ICD-10-CM | POA: Diagnosis not present

## 2023-08-16 DIAGNOSIS — E039 Hypothyroidism, unspecified: Secondary | ICD-10-CM | POA: Diagnosis not present

## 2023-08-22 DIAGNOSIS — M25519 Pain in unspecified shoulder: Secondary | ICD-10-CM | POA: Diagnosis not present

## 2023-08-23 DIAGNOSIS — E039 Hypothyroidism, unspecified: Secondary | ICD-10-CM | POA: Diagnosis not present

## 2023-08-23 DIAGNOSIS — E785 Hyperlipidemia, unspecified: Secondary | ICD-10-CM | POA: Diagnosis not present

## 2023-08-23 DIAGNOSIS — M810 Age-related osteoporosis without current pathological fracture: Secondary | ICD-10-CM | POA: Diagnosis not present

## 2023-08-23 DIAGNOSIS — I10 Essential (primary) hypertension: Secondary | ICD-10-CM | POA: Diagnosis not present

## 2023-08-23 DIAGNOSIS — E559 Vitamin D deficiency, unspecified: Secondary | ICD-10-CM | POA: Diagnosis not present

## 2023-08-29 DIAGNOSIS — M25519 Pain in unspecified shoulder: Secondary | ICD-10-CM | POA: Diagnosis not present

## 2023-09-01 DIAGNOSIS — M25519 Pain in unspecified shoulder: Secondary | ICD-10-CM | POA: Diagnosis not present

## 2023-09-11 DIAGNOSIS — M810 Age-related osteoporosis without current pathological fracture: Secondary | ICD-10-CM | POA: Diagnosis not present

## 2023-09-18 ENCOUNTER — Telehealth: Payer: Self-pay

## 2023-09-18 NOTE — Telephone Encounter (Signed)
 She had stopped by my office last week to inquire about PREP, called me yesterday to let me know she would like to attend the 1/14 class at [facility], referral requested from Dr. Antoine Poche;

## 2023-09-20 NOTE — Progress Notes (Signed)
 [facility] PREP Evaluation  Patient Details  Name: Jenny Ford MRN: [id number] Date of Birth: 1951/02/29 Age: 73 y.o. PCP: Clarice Nottingham, MD  Vitals:   09/20/23 1037  BP: (!) 164/84  Pulse: 78  SpO2: 98%  Weight: 140 lb 9.6 oz (63.8 kg)     [facility] Eval - 09/20/23 1000       [facility] PREP Location   [facility] PREP Location [facility]      Referral    Referring Provider Hochrein    Reason for referral Hypertension    Program Start Date 09/25/23      Measurement   Waist Circumference 34 inches    Hip Circumference 37 inches    Body fat 37.4 percent      Information for Trainer   Goals --   Better nutrtion, strength training   Current Exercise --   aerobics/strength training 2-3 times/wk at [facility]   Pertinent Medical History --   HTN, hypothyroid     Timed Up and Go (TUGS)   Timed Up and Go Low risk <9 seconds      Mobility and Daily Activities   I find it easy to walk up or down two or more flights of stairs. 4    I have no trouble taking out the trash. 4    I do housework such as vacuuming and dusting on my own without difficulty. 4    I can easily lift a gallon of milk (8lbs). 4    I can easily walk a mile. 4    I have no trouble reaching into high cupboards or reaching down to pick up something from the floor. 4    I do not have trouble doing out-door work such as loss adjuster, chartered, raking leaves, or gardening. 4      Mobility and Daily Activities   I feel younger than my age. 4    I feel independent. 4    I feel energetic. 3    I live an active life.  4    I feel strong. 4    I feel healthy. 4    I feel active as other people my age. 4      How fit and strong are you.   Fit and Strong Total Score 55            Past Medical History:  Diagnosis Date   Hyperlipidemia    LDL 172(1674/444),TG 106  . LDL goal = < 140   Hypertension    Squamous cell carcinoma of skin 01/14/2018   scc bridge of nose tx cx3 42fu   Thyroid  disease    thyroid  nodules; Dr.Balan    Past Surgical History:  Procedure Laterality Date   COLONOSCOPY  2006   Negative; Dr Obie   G2 P2     MOUTH SURGERY     maxillary osteoma resected; Dr Ysidro Daring   septopasty     tendon tear      L ankle    Social History   Tobacco Use  Smoking Status Never  Smokeless Tobacco Never  To begin PREP class on 1/14, every T/Th 2-3:15; has not taken BP meds today, encouraged to consider taking earlier in the day instead of noon.  [name] [name] 09/20/2023, 10:40 AM

## 2023-09-25 NOTE — Progress Notes (Signed)
 [facility] PREP Weekly Session  Patient Details  Name: MAKENZY KRIST MRN: [id number] Date of Birth: [birth year] Age: 73 y.o. PCP: Clarice Nottingham, MD  There were no vitals filed for this visit.   [facility] Weekly seesion - 09/25/23 1500       [facility] PREP Location   [facility] PREP Location [facility]      Weekly Session   Topic Discussed Goal setting and welcome to the program   Introductions, reivew of notebook, tour of facility; option to work out on cardio machine   Classes attended to date 1             Adonna KATHEE Mody 09/25/2023, 3:51 PM

## 2023-10-02 NOTE — Progress Notes (Signed)
[facility] PRE[facility] Weekly Session  Patient Details  Name: Jenny Ford MRN: [id number] Date of Birth: 1950/10/17 Age: 73 y.o. PCP: Merri Brunette, MD  Vitals:   10/02/23 1533  Weight: 140 lb 12.8 oz (63.9 kg)     [facility] Weekly seesion - 10/02/23 1500       [facility] "PREP" Location   [facility] "PREP[facility] Location [facility]      Weekly Session   Topic Discussed Importance of resistance training;Other ways to be active   Goal: work up to 150 minutes of cardio/wk; strength training 2-3 times a week for 20-40 minutes   Minutes exercised this week 150 minutes    Classes attended to date 3             Sonia Baller 10/02/2023, 3:33 PM

## 2023-10-09 NOTE — Progress Notes (Signed)
[facility] PRE[facility] Weekly Session  Patient Details  Name: Jenny Ford MRN: [id number] Date of Birth: Aug 21, 1951 Age: 73 y.o. PCP: Merri Brunette, MD  Vitals:   10/09/23 1518  Weight: 141 lb (64 kg)     [facility] Weekly seesion - 10/09/23 1500       [facility]PREP[facility] Location   [facility]PREP[facility] Location [facility]      Weekly Session   Topic Discussed Health habits   Foods in decrease, foods to increase; introduced YUKA app   Minutes exercised this week 260 minutes    Classes attended to date 5             Adolph Clutter B Laura-Lee Villegas 10/09/2023, 3:19 PM

## 2023-10-16 NOTE — Progress Notes (Signed)
  [facility] PRE[facility] Weekly Session  Patient Details  Name: Jenny Ford MRN: [id number] Date of Birth: [birth year] Age: 73 y.o. PCP: Clarice Nottingham, MD  Vitals:   10/16/23 1508  Weight: 139 lb 9.6 oz (63.3 kg)     [facility] Weekly seesion - 10/16/23 1500       [facility] PREP Location   [facility] PREP Location [facility]      Weekly Session   Topic Discussed Health habits   Sugar demo; limit added sugars for 24 gm for women and 36 gms for men; stay hydrated---1/2 body in oz of water/day   Minutes exercised this week 280 minutes    Classes attended to date 7             Janneth Krasner B Kathlen Sakurai 10/16/2023, 3:12 PM

## 2023-10-23 NOTE — Progress Notes (Signed)
[facility] PRE[facility] Weekly Session  Patient Details  Name: Jenny Ford MRN: [id number] Date of Birth: 11-22-1950 Age: 73 y.o. PCP: Merri Brunette, MD  Vitals:   10/23/23 1526  Weight: 139 lb 3.2 oz (63.1 kg)     [facility] Weekly seesion - 10/23/23 1500       [facility]PREP[facility] Location   [facility]PREP[facility] Location [facility]      Weekly Session   Topic Discussed Restaurant Eating   Limit salt intake to 1500-2300mg /day; salt demo   Minutes exercised this week 225 minutes    Classes attended to date 69             Markeya Mincy B Loney Domingo 10/23/2023, 3:26 PM

## 2023-10-30 NOTE — Progress Notes (Signed)
[facility] PRE[facility] Weekly Session  Patient Details  Name: Jenny Ford MRN: [id number] Date of Birth: 02-02-1951 Age: 73 y.o. PCP: Merri Brunette, MD  Vitals:   10/30/23 1529  Weight: 139 lb (63 kg)     [facility] Weekly seesion - 10/30/23 1500       [facility]PREP[facility] Location   [facility]PREP[facility] Location [facility]      Weekly Session   Topic Discussed Stress management and problem solving   importance of sleep; finger tip mudra breathwork practice; shared electronic version of sleep guidlines   Minutes exercised this week 310 minutes    Classes attended to date 81             Jenny Ford 10/30/2023, 3:31 PM

## 2023-11-06 NOTE — Progress Notes (Signed)
 [facility] Weekly Session  Patient Details  Name: Jenny Ford MRN: [id number] Date of Birth: April 07, 1951 Age: 73 y.o. PCP: Merri Brunette, MD  Vitals:   11/06/23 1534  Weight: 142 lb (64.4 kg)     [facility] Weekly seesion - 11/06/23 1500       [facility] "PREP[facility] Location   [facility]PREP[facility] Location [facility]      Weekly Session   Topic Discussed Expectations and non-scale victories   Halfway thru program, reviewed lifestyle change recomendations for added sugars, sodium limits, water, amt of cardio/strength training; review/reflect/restate goals---what's going well and what's been a challenge   Minutes exercised this week 240 minutes    Classes attended to date 63             Mikaylee Arseneau B Klair Leising 11/06/2023, 3:34 PM

## 2023-11-13 NOTE — Progress Notes (Signed)
 [facility] Weekly Session  Patient Details  Name: Jenny Ford MRN: [id number] Date of Birth: 02-10-1951 Age: 73 y.o. PCP: Merri Brunette, MD  Vitals:   11/13/23 1527  Weight: 140 lb 8 oz (63.7 kg)     [facility] Weekly seesion - 11/13/23 1500       [facility] "PREP[facility] Location   [facility]PREP[facility] Location [facility]      Weekly Session   Topic Discussed Eating for the season;Water    Minutes exercised this week 330 minutes    Classes attended to date 8             Sonia Baller 11/13/2023, 3:29 PM

## 2023-11-20 NOTE — Progress Notes (Signed)
 [facility] Weekly Session  Patient Details  Name: Jenny Ford MRN: [id number] Date of Birth: Feb 01, 1951 Age: 73 y.o. PCP: Merri Brunette, MD  Vitals:   11/20/23 1532  Weight: 140 lb 9.6 oz (63.8 kg)     [facility] Weekly seesion - 11/20/23 1500       [facility] "PREP" Location   [facility]PREP[facility] Location [facility]      Weekly Session   Topic Discussed Other   Portion control, visualize your portion size demo; review of [employer] food label.   Minutes exercised this week 205 minutes    Classes attended to date 59             Sonia Baller 11/20/2023, 3:33 PM

## 2023-11-27 NOTE — Progress Notes (Signed)
 [facility] Weekly Session  Patient Details  Name: HENRETTER PIEKARSKI MRN: [id number] Date of Birth: May 25, 1951 Age: 73 y.o. PCP: Merri Brunette, MD  Vitals:   11/27/23 1519  Weight: 140 lb 3.2 oz (63.6 kg)     [facility] Weekly seesion - 11/27/23 1500       [facility] "PREP[facility] Location   [facility]PREP[facility] Location [facility]      Weekly Session   Topic Discussed Calorie breakdown   Current USDA dietary guidelines for Carbs, proteins and fat macronutrients; review of trustworthy supplement organizations   Minutes exercised this week 280 minutes    Classes attended to date 78             Furman Trentman B Averleigh Savary 11/27/2023, 3:22 PM

## 2023-12-11 NOTE — Progress Notes (Signed)
 [facility] Weekly Session  Patient Details  Name: Jenny Ford MRN: [id number] Date of Birth: [birth year] Age: 73 y.o. PCP: Merri Brunette, MD  Vitals:   12/11/23 1503  Weight: 140 lb 12.8 oz (63.9 kg)     [facility] Weekly seesion - 12/11/23 1500       [facility] "PREP" Location   [facility]PREP[facility] Location [facility]      Weekly Session   Topic Discussed Other   fit testing completed, how fit and strong survey completed, final assessment visits scheduled   Minutes exercised this week 220 minutes    Classes attended to date 67             Rasheka Denard B Kiely Cousar 12/11/2023, 3:04 PM

## 2023-12-13 NOTE — Progress Notes (Signed)
 [facility] PRE[facility] Evaluation  Patient Details  Name: Jenny Ford MRN: [id number] Date of Birth: 12/30/1950 Age: 73 y.o. PCP: Merri Brunette, MD  Vitals:   12/13/23 1444  BP: 110/72  Pulse: 83  SpO2: 97%  Weight: 140 lb 12.8 oz (63.9 kg)     [facility] Eval - 12/13/23 1400       Referral    Program Start Date 09/25/23    Program End Date 12/13/23      Measurement   Waist Circumference 34 inches    Waist Circumference End Program 34 inches    Hip Circumference 37 inches    Hip Circumference End Program 37 inches    Body fat 38.6 percent      Mobility and Daily Activities   I find it easy to walk up or down two or more flights of stairs. 4    I have no trouble taking out the trash. 4    I do housework such as vacuuming and dusting on my own without difficulty. 4    I can easily lift a gallon of milk (8lbs). 4    I can easily walk a mile. 4    I have no trouble reaching into high cupboards or reaching down to pick up something from the floor. 4    I do not have trouble doing out-door work such as Loss adjuster, chartered, raking leaves, or gardening. 4      Mobility and Daily Activities   I feel younger than my age. 3    I feel independent. 3    I feel energetic. 3    I live an active life.  3    I feel strong. 3    I feel healthy. 3    I feel active as other people my age. 3      How fit and strong are you.   Fit and Strong Total Score 49            Past Medical History:  Diagnosis Date   Hyperlipidemia    LDL 172(1674/444),TG 106  . LDL goal = < 140   Hypertension    Squamous cell carcinoma of skin 01/14/2018   scc bridge of nose tx cx3 2fu   Thyroid disease    thyroid nodules; Dr.Balan   Past Surgical History:  Procedure Laterality Date   COLONOSCOPY  2006   Negative; Dr Juanda Chance   G2 P2     MOUTH SURGERY     maxillary osteoma resected; Dr Dereck Leep   septopasty     tendon tear      L ankle    Social History   Tobacco Use  Smoking Status Never  Smokeless  Tobacco Never  Education sessions completed: 11 Strength training sessions completed: 10 BP @start : 164/84 BP @end : 110/72  Leland Raver B Miyanna Wiersma 12/13/2023, 2:46 PM

## 2024-02-25 DIAGNOSIS — H2513 Age-related nuclear cataract, bilateral: Secondary | ICD-10-CM | POA: Diagnosis not present

## 2024-02-25 DIAGNOSIS — H40013 Open angle with borderline findings, low risk, bilateral: Secondary | ICD-10-CM | POA: Diagnosis not present

## 2024-02-25 DIAGNOSIS — H52203 Unspecified astigmatism, bilateral: Secondary | ICD-10-CM | POA: Diagnosis not present

## 2024-02-26 DIAGNOSIS — E039 Hypothyroidism, unspecified: Secondary | ICD-10-CM | POA: Diagnosis not present

## 2024-03-20 ENCOUNTER — Other Ambulatory Visit: Payer: Self-pay | Admitting: Internal Medicine

## 2024-03-20 DIAGNOSIS — Z1231 Encounter for screening mammogram for malignant neoplasm of breast: Secondary | ICD-10-CM

## 2024-03-31 DIAGNOSIS — M810 Age-related osteoporosis without current pathological fracture: Secondary | ICD-10-CM | POA: Diagnosis not present

## 2024-03-31 DIAGNOSIS — I10 Essential (primary) hypertension: Secondary | ICD-10-CM | POA: Diagnosis not present

## 2024-03-31 DIAGNOSIS — R748 Abnormal levels of other serum enzymes: Secondary | ICD-10-CM | POA: Diagnosis not present

## 2024-04-03 DIAGNOSIS — I10 Essential (primary) hypertension: Secondary | ICD-10-CM | POA: Diagnosis not present

## 2024-04-03 DIAGNOSIS — Z0000 Encounter for general adult medical examination without abnormal findings: Secondary | ICD-10-CM | POA: Diagnosis not present

## 2024-04-03 DIAGNOSIS — E785 Hyperlipidemia, unspecified: Secondary | ICD-10-CM | POA: Diagnosis not present

## 2024-04-03 DIAGNOSIS — E039 Hypothyroidism, unspecified: Secondary | ICD-10-CM | POA: Diagnosis not present

## 2024-04-03 DIAGNOSIS — M810 Age-related osteoporosis without current pathological fracture: Secondary | ICD-10-CM | POA: Diagnosis not present

## 2024-04-10 ENCOUNTER — Ambulatory Visit
Admission: RE | Admit: 2024-04-10 | Discharge: 2024-04-10 | Disposition: A | Discharge status: Home / Self Care | Source: Ambulatory Visit | Attending: Internal Medicine | Admitting: Internal Medicine

## 2024-04-10 DIAGNOSIS — Z1231 Encounter for screening mammogram for malignant neoplasm of breast: Secondary | ICD-10-CM

## 2024-04-18 DIAGNOSIS — D225 Melanocytic nevi of trunk: Secondary | ICD-10-CM | POA: Diagnosis not present

## 2024-04-18 DIAGNOSIS — L814 Other melanin hyperpigmentation: Secondary | ICD-10-CM | POA: Diagnosis not present

## 2024-04-18 DIAGNOSIS — D485 Neoplasm of uncertain behavior of skin: Secondary | ICD-10-CM | POA: Diagnosis not present

## 2024-04-18 DIAGNOSIS — L821 Other seborrheic keratosis: Secondary | ICD-10-CM | POA: Diagnosis not present

## 2024-04-18 DIAGNOSIS — Z85828 Personal history of other malignant neoplasm of skin: Secondary | ICD-10-CM | POA: Diagnosis not present

## 2024-04-18 DIAGNOSIS — L080 Pyoderma: Secondary | ICD-10-CM | POA: Diagnosis not present

## 2024-04-18 DIAGNOSIS — D1801 Hemangioma of skin and subcutaneous tissue: Secondary | ICD-10-CM | POA: Diagnosis not present

## 2024-05-23 DIAGNOSIS — E785 Hyperlipidemia, unspecified: Secondary | ICD-10-CM | POA: Diagnosis not present

## 2024-07-10 DIAGNOSIS — E785 Hyperlipidemia, unspecified: Secondary | ICD-10-CM | POA: Diagnosis not present

## 2024-07-14 DIAGNOSIS — I10 Essential (primary) hypertension: Secondary | ICD-10-CM | POA: Diagnosis not present

## 2024-07-14 DIAGNOSIS — E785 Hyperlipidemia, unspecified: Secondary | ICD-10-CM | POA: Diagnosis not present

## 2024-07-14 DIAGNOSIS — L821 Other seborrheic keratosis: Secondary | ICD-10-CM | POA: Diagnosis not present

## 2024-07-16 DIAGNOSIS — L814 Other melanin hyperpigmentation: Secondary | ICD-10-CM | POA: Diagnosis not present

## 2024-07-16 DIAGNOSIS — L905 Scar conditions and fibrosis of skin: Secondary | ICD-10-CM | POA: Diagnosis not present

## 2024-07-16 DIAGNOSIS — D2371 Other benign neoplasm of skin of right lower limb, including hip: Secondary | ICD-10-CM | POA: Diagnosis not present

## 2024-07-29 DIAGNOSIS — R3589 Other polyuria: Secondary | ICD-10-CM | POA: Diagnosis not present
